# Patient Record
Sex: Female | Born: 1945 | Race: White | Hispanic: No | Marital: Single | Smoking: Never smoker
Health system: Southern US, Community
[De-identification: ages and names within clinical notes are randomized; demographics above are authoritative.]

## PROBLEM LIST (undated history)

## (undated) DIAGNOSIS — I10 Essential (primary) hypertension: Secondary | ICD-10-CM

## (undated) DIAGNOSIS — J45909 Unspecified asthma, uncomplicated: Secondary | ICD-10-CM

## (undated) DIAGNOSIS — E079 Disorder of thyroid, unspecified: Secondary | ICD-10-CM

## (undated) DIAGNOSIS — C801 Malignant (primary) neoplasm, unspecified: Secondary | ICD-10-CM

## (undated) HISTORY — DX: Malignant (primary) neoplasm, unspecified: C80.1

## (undated) HISTORY — PX: APPENDECTOMY: SHX54

## (undated) HISTORY — PX: CARDIAC VALVE REPLACEMENT: SHX585

## (undated) HISTORY — PX: BRAIN SURGERY: SHX531

## (undated) HISTORY — DX: Disorder of thyroid, unspecified: E07.9

## (undated) HISTORY — DX: Essential (primary) hypertension: I10

## (undated) HISTORY — PX: BREAST SURGERY: SHX581

## (undated) HISTORY — DX: Unspecified asthma, uncomplicated: J45.909

## (undated) HISTORY — PX: TUBAL LIGATION: SHX77

---

## 2008-06-15 ENCOUNTER — Observation Stay (HOSPITAL_COMMUNITY): Admission: EM | Admit: 2008-06-15 | Discharge: 2008-06-16 | Payer: Self-pay | Admitting: Emergency Medicine

## 2008-06-15 ENCOUNTER — Ambulatory Visit: Payer: Self-pay | Admitting: Internal Medicine

## 2008-06-16 ENCOUNTER — Encounter (INDEPENDENT_AMBULATORY_CARE_PROVIDER_SITE_OTHER): Payer: Self-pay | Admitting: Internal Medicine

## 2008-06-17 ENCOUNTER — Ambulatory Visit: Payer: Self-pay

## 2009-04-21 ENCOUNTER — Telehealth: Payer: Self-pay | Admitting: Internal Medicine

## 2009-05-02 ENCOUNTER — Telehealth: Payer: Self-pay | Admitting: Internal Medicine

## 2011-03-27 NOTE — H&P (Signed)
NAMEBRENDA, Miranda Elliott                 ACCOUNT NO.:  [id number]   MEDICAL RECORD NO.:  [id number]          PATIENT TYPE:  EMS   LOCATION:  MAJO                         FACILITY:  [facility]   PHYSICIAN:  Vania Rea, M.D. DATE OF BIRTH:  08/29/1946   DATE OF ADMISSION:  06/15/2008  DATE OF DISCHARGE:                              HISTORY & PHYSICAL   PRIMARY CARE PHYSICIAN:  Dr. Rushie Nyhan in [place], telephone  number [phone].   CHIEF COMPLAINT:  Chest pain.   HISTORY OF PRESENT ILLNESS:  This is a 65 year old Caucasian lady who  works as a Runner, broadcasting/film/video and has been traveling out of her home of [place]  since June.  She recently returned from the [place].  She had a fever a few  days before she returned and did not feel well.  She went to a clinic  and they checked for H1N1 virus, and this was negative.  She awoke last  night with difficulty breathing, felt she may have been having an asthma  attack.  She called her doctor and he said she could use her husband's  Symbicort.  She used the Symbicort and did feel better, fell back  asleep, but later woke up with severe chest pain that was not radiating,  was not associated with diaphoresis, but was associated with continuous  soreness and pressure in her chest.  It was not likely any pain she has  previously felt and not like her peptic ulcer disease.  She called an  ambulance and came to the emergency room after having taken as aspirin,  but the pain had already subsided.  She did have nitroglycerin paste  placed.  That has given her a headache, but has not really made much  difference to the pain.   PAST MEDICAL HISTORY:  1. Remote history of asthma.  2. Hypertension, well controlled.  3. Hypothyroidism.  4. Peptic ulcer disease.  5. Recurrent urinary tract infections.  6. Migraines.  7. Aortic enlargement with aortic insufficiency.   MEDICATIONS:  Azor, calcium, Cozaar, nitrofurantoin, omeprazole,  Topamax, Urex, Coenzyme  Q10, Synthroid 112 mcg daily.  The patient does  not remember the doses of her medications.   ALLERGIES:  NO KNOWN DRUG ALLERGIES.   SOCIAL HISTORY:  Works as a Runner, broadcasting/film/video.  Denies tobacco, alcohol or illicit  drug use.   FAMILY HISTORY:  There is no family history of coronary artery disease.   REVIEW OF SYSTEMS:  Other than noted above, significant only for the  fact that the patient had a coronary angiogram about a year ago and was  told that her coronary arteries are completely clean.   PHYSICAL EXAMINATION:  GENERAL:  A very pleasant, but somewhat anxious  small-built middle-aged Caucasian lady sitting up on a stretcher.  VITAL SIGNS:  Temperature is 97.7, pulse 77, respirations 20, blood  pressure 113/62.  She is saturating at 97% on 2 liters.  HEENT:  Pupils are round and equal.  Mucous membranes are pink and  anicteric.  She is not dehydrated.  NECK:  She has no thyromegaly.  CHEST:  Clear to auscultation bilaterally.  CARDIOVASCULAR:  Regular rhythm with a 2/6 systolic murmur.  ABDOMEN:  Soft and nontender.  EXTREMITIES:  Without edema.  CENTRAL NERVOUS SYSTEM:  Cranial nerves II through XII are grossly  intact.  She has no focal neurologic deficit.   LABORATORY DATA:  CBC is completely normal.  Her platelet count is 142.  Coags are normal.  Her D-dimer is 0.38.  Beta natriuretic peptide is  undetectable.  Her sodium is 140, potassium 3.4, chloride 107, CO2 of  22, glucose 98, BUN 21, creatinine 0.72.  Her calcium is 7.3, magnesium  2.0, troponin is 0.01.  Chest x-ray shows no acute disease.  EKG is not  in evidence at this time, but I was told that it showed no evidence of  acute disease.   ASSESSMENT:  1. Atypical chest pain in a middle-aged lady with some risk factors.  2. Hypokalemia.  3. Dehydration as evidenced by elevated BUN and creatinine ratio.   PLAN:  Will admit this lady for hydration.  Replace her potassium and  serial cardiac enzymes to rule out an acute  MI.  Will also give her high-  dose PPIs in case she is having GERD symptoms and will give her p.r.n.  nebs.  Other plans as per orders.      Vania Rea, M.D.  Electronically Signed     LC/MEDQ  D:  06/15/2008  T:  06/15/2008  Job:  [id number]   cc:   Rushie Nyhan, MD

## 2011-03-27 NOTE — Discharge Summary (Signed)
NAMELevora Elliott Aldora                 ACCOUNT NO.:  [id number]   MEDICAL RECORD NO.:  [id number]          PATIENT TYPE:  OBV   LOCATION:  2006                         FACILITY:  [facility]   PHYSICIAN:  Lonia Blood, M.D.       DATE OF BIRTH:  [birth year]   DATE OF ADMISSION:  06/15/2008  DATE OF DISCHARGE:  06/16/2008                               DISCHARGE SUMMARY   PRIMARY CARE PHYSICIAN:  The patient's primary care physician is in [place], [place].   DISCHARGE DIAGNOSES:  1. Chest pain - scheduled for outpatient stress test.  2. Hyperreactive airways.  3. History of asthma.  4. Hypertension.  5. Hypothyroidism.  6. Migraine headaches.  7. Urinary tract infection.   DISCHARGE MEDICATIONS:  1. Synthroid 112 mcg daily.  2. Omeprazole 20 mg daily.  3. Ventolin 1 puff 3 times a day as needed.  4. Azor daily.  5. Topamax twice a day.  6. Urex daily.   CONDITION ON DISCHARGE:  Ms. Miranda Elliott was discharged in good condition.  At the time of discharge, she is asymptomatic.  She will follow up with  Dr. Gala Romney from [facility] on June 17, 2008 for an  outpatient stress test.   PROCEDURES:  During this admission, the patient had a transthoracic  echocardiogram which was read by Dr. Gala Romney and felt that she was  without any wall motion abnormalities with a mild aortic insufficiency  and a mild aortic stenosis.   HISTORY AND PHYSICAL:  Refer to the dictated H&P done by Dr. Vania Rea on June 15, 2008.   HOSPITAL COURSE:  1. Chest pain.  Ms. Miranda Elliott was admitted for chest pain rule out MI.      She had 3 sets cardiac enzymes which were all within normal limits.      She had a transthoracic echocardiogram which was fairly normal.      She had some negative T-waves in V1-V3 and for this reason, she is      scheduled for an outpatient stress test.  The patient had D-dimer      level of 0.38 and normal oxygen saturations on room air ruling out      possibility of  pulmonary emboli.  Given the patient's history of      asthma and her travel across the country and exposure to new      allergens, I do feel that her symptoms might be secondary to      hyperreactive airways.  For this reason, I prescribed the patient a      Ventolin inhaler.  2. Hypertension.  The patient was found to take 2 angiotensin receptor      blockers.  She was instructed to stop the      Cozaar and use Azor daily.  3. Urinary tract infection.  Ms. Miranda Elliott was placed on ciprofloxacin,      and she did defervesce and responded nicely to the medication.  She      is prescribed 5 more days of this antibiotic as an outpatient.  Lonia Blood, M.D.  Electronically Signed     SL/MEDQ  D:  06/16/2008  T:  06/17/2008  Job:  [id number]

## 2011-03-27 NOTE — Consult Note (Signed)
NAMELevora Elliott Elliott                 ACCOUNT NO.:  [id number]   MEDICAL RECORD NO.:  [id number]          PATIENT TYPE:  INP   LOCATION:  2006                         FACILITY:  [facility]   PHYSICIAN:  Bevelyn Buckles. Bensimhon, MDDATE OF BIRTH:  [birth year]   DATE OF CONSULTATION:  06/15/2008  DATE OF DISCHARGE:                                 CONSULTATION   REQUESTING PHYSICIAN:  Miranda Pier, MD, with [facility].   REASON FOR CONSULTATION:  Chest pain and shortness of breath.   Ms. Miranda Elliott is a very pleasant 65 year old woman who lives in [place].  She has a history of hypertension, hypothyroidism, aortic  insufficiency and migraine headaches as well as peptic ulcer disease.  She also has a history of intermittent chest pain.  She has been  followed by Dr. Lynnae January in [facility], for her aortic insufficiency  which appears to be stable.  Last year she had an episode of severe  chest pain, underwent cardiac catheterization, had normal coronary  arteries and normal LV function.   She was recently in the [place] on her honeymoon with her husband.  She  returned back to [facility] to visit family about a week ago.  On Sunday  night she developed fever and went to the clinic and was found to have a  UTI, she also had a nasal swab for flu which was negative.  Last night  or early this morning, she felt short of breath, similar to her previous  asthma.  She took her husband's Symbicort inhaler and felt much better;  however, about 3 hours later she awoke with intense chest pain, this  lasted about 5 minutes, there was no associated symptoms.  EMS was  called.  By the time they got there, she felt much better.  She was  brought to the emergency room for evaluation.   She currently feels fine, has been able to ambulate without any chest  pain.  She has not had any orthopnea, no PND, no lower extremity edema,  no mental status changes.  She has not had any reflux disease.  There  has been no  melena or bright red blood per rectum.   REVIEW OF SYSTEMS:  Is as per HPI and problem list, otherwise all  systems negative.   PROBLEM LIST:  1. History of chest pain.      a.     Normal coronary arteries by cardiac catheterization about a       year ago.  2. History of asthma.  3. Hypertension.  4. Hypothyroidism.  5. Aortic insufficiency which has been stable.  6. Peptic ulcer disease.  7. Migraine headaches.   MEDICATIONS ON ADMISSION:  Azor and Cozaar, also calcium,  nitrofurantoin, Prilosec, Topamax, Urex, Coenzyme Q12 and Synthroid.   ALLERGIES:  No known drug allergies.   SOCIAL HISTORY:  She works as a Runner, broadcasting/film/video in [employer].  No alcohol or  tobacco use.  She is very active.   FAMILY HISTORY:  There is no family history of premature coronary artery  disease.   PHYSICAL EXAM:  She is  well-appearing, no acute distress.  Respirations  are unlabored.  Temperature is 100.2, blood pressure is 98/67, heart  rate is 96, she is sating 95% on room air.  HEENT:  Normal.  NECK:  Supple.  There is no JVD.  Carotids are 2+ bilaterally with  bilateral radiated bruits.  There is no lymphadenopathy or thyromegaly.  CARDIAC:  She has a regular rate and rhythm with a prominent 3/6  systolic ejection murmur at the right sternal border on Valsalva.  She  does also have mild diastolic murmur, there is no rub or gallop.  LUNGS:  Are clear, no wheezing.  ABDOMEN:  Soft, nontender, nondistended.  No hepatosplenomegaly, no  bruits.  No masses.  EXTREMITIES:  Warm with no cyanosis, clubbing or edema.  There is good  distal pulses, there is no rashes, there is no splinter hemorrhages,  there is no Janeway lesions or Osler's nodes.   Chest x-ray shows mild bibasilar atelectasis, no acute edema.  EKG shows  normal sinus rhythm, there is nonspecific ST-T wave changes for the V1-  V3, there is no old for comparison.  TSH is normal, troponin is 0.02 and  0.01, MBs are negative.  Urinalysis is  positive.  BNP is less than 30.  D-dimer is negative at 0.38.  BMET is sodium 140, potassium 3.3, BUN 21,  creatinine 0.72.  CBC shows a white count of 7.5, platelets of 142 and  hemoglobin of 12.7.   ASSESSMENT:  1. Chest pain, atypical, now resolved.  2. History of asthma.  3. Aortic insufficiency.  4. Urinary tract infection and fever.   PLAN/DISCUSSION:  I suspect her chest pain is noncardiac.  She has had a  cardiac catheterization just a year ago which showed normal coronary  arteries.  I think we can discharge her home as long as she is feeling  better.  We will set up an echocardiogram tomorrow to further evaluate  her aortic valve and make sure there has not been any significant  change.  She does not have any stigmata of endocarditis but I do think  given her fever it is important to make sure the valve is okay.  I will  discuss this with the rounding team.  We will also check an EKG prior to  discharge to make sure there has been no significant change.      Bevelyn Buckles. Bensimhon, MD  Electronically Signed     Bevelyn Buckles. Bensimhon, MD  Electronically Signed    DRB/MEDQ  D:  06/15/2008  T:  06/15/2008  Job:  [id number]   cc:   Jene Every. Patricia Nettle, MD

## 2011-08-10 LAB — MAGNESIUM: Magnesium: 2.2

## 2011-08-10 LAB — URINALYSIS, ROUTINE W REFLEX MICROSCOPIC
Bilirubin Urine: NEGATIVE
Ketones, ur: 15 — AB
Nitrite: NEGATIVE
Protein, ur: NEGATIVE
Specific Gravity, Urine: 1.014
Urobilinogen, UA: 0.2

## 2011-08-10 LAB — URINE CULTURE: Colony Count: NO GROWTH

## 2011-08-10 LAB — B-NATRIURETIC PEPTIDE (CONVERTED LAB): Pro B Natriuretic peptide (BNP): <30

## 2011-08-10 LAB — CK TOTAL AND CKMB (NOT AT ARMC): Relative Index: INVALID

## 2011-08-10 LAB — BASIC METABOLIC PANEL
CO2: 22
Chloride: 107
Creatinine, Ser: 0.72
GFR calc Af Amer: >60
Potassium: 3.3 — ABNORMAL LOW

## 2011-08-10 LAB — PROTIME-INR
INR: 0.9
Prothrombin Time: 12.6

## 2011-08-10 LAB — TROPONIN I: Troponin I: 0.01

## 2011-08-10 LAB — CBC
HCT: 37.4
Hemoglobin: 12.7
MCHC: 34.1
MCV: 90.3
RBC: 4.14
WBC: 7.5

## 2011-08-10 LAB — CARDIAC PANEL(CRET KIN+CKTOT+MB+TROPI)
Relative Index: INVALID
Total CK: 36
Total CK: 44
Troponin I: 0.01

## 2011-08-10 LAB — TSH: TSH: 0.806

## 2011-08-10 LAB — URINE MICROSCOPIC-ADD ON

## 2011-08-10 LAB — APTT: aPTT: 34

## 2011-08-10 LAB — D-DIMER, QUANTITATIVE: D-Dimer, Quant: 0.38

## 2014-12-12 ENCOUNTER — Emergency Department (HOSPITAL_COMMUNITY): Payer: Medicare Other

## 2014-12-12 ENCOUNTER — Encounter (HOSPITAL_COMMUNITY): Payer: Self-pay | Admitting: Radiology

## 2014-12-12 ENCOUNTER — Emergency Department (HOSPITAL_COMMUNITY)
Admission: EM | Admit: 2014-12-12 | Discharge: 2014-12-12 | Disposition: A | Discharge status: Home / Self Care | Payer: Medicare Other | Attending: Emergency Medicine | Admitting: Emergency Medicine

## 2014-12-12 DIAGNOSIS — R079 Chest pain, unspecified: Secondary | ICD-10-CM | POA: Insufficient documentation

## 2014-12-12 DIAGNOSIS — Z7951 Long term (current) use of inhaled steroids: Secondary | ICD-10-CM | POA: Insufficient documentation

## 2014-12-12 DIAGNOSIS — Z8679 Personal history of other diseases of the circulatory system: Secondary | ICD-10-CM | POA: Insufficient documentation

## 2014-12-12 DIAGNOSIS — Z853 Personal history of malignant neoplasm of breast: Secondary | ICD-10-CM | POA: Insufficient documentation

## 2014-12-12 DIAGNOSIS — Z79818 Long term (current) use of other agents affecting estrogen receptors and estrogen levels: Secondary | ICD-10-CM | POA: Insufficient documentation

## 2014-12-12 DIAGNOSIS — Z79899 Other long term (current) drug therapy: Secondary | ICD-10-CM | POA: Insufficient documentation

## 2014-12-12 DIAGNOSIS — Z880 Allergy status to penicillin: Secondary | ICD-10-CM | POA: Insufficient documentation

## 2014-12-12 LAB — BASIC METABOLIC PANEL
Anion gap: 6 (ref 5–15)
BUN: 18 mg/dL (ref 6–23)
CO2: 26 mmol/L (ref 19–32)
Calcium: 10.1 mg/dL (ref 8.4–10.5)
Chloride: 110 mmol/L (ref 96–112)
Creatinine, Ser: 0.89 mg/dL (ref 0.50–1.10)
GFR calc Af Amer: 76 mL/min — ABNORMAL LOW (ref >90)
GFR calc non Af Amer: 66 mL/min — ABNORMAL LOW (ref >90)
Glucose, Bld: 112 mg/dL — ABNORMAL HIGH (ref 70–99)
Potassium: 4.1 mmol/L (ref 3.5–5.1)
Sodium: 142 mmol/L (ref 135–145)

## 2014-12-12 LAB — I-STAT TROPONIN, ED: Troponin i, poc: 0 ng/mL (ref 0.00–0.08)

## 2014-12-12 LAB — CBC
HCT: 41.9 % (ref 36.0–46.0)
Hemoglobin: 13.8 g/dL (ref 12.0–15.0)
MCH: 30.1 pg (ref 26.0–34.0)
MCHC: 32.9 g/dL (ref 30.0–36.0)
MCV: 91.5 fL (ref 78.0–100.0)
Platelets: 154 10*3/uL (ref 150–400)
RBC: 4.58 MIL/uL (ref 3.87–5.11)
RDW: 13.7 % (ref 11.5–15.5)
WBC: 7.3 10*3/uL (ref 4.0–10.5)

## 2014-12-12 MED ORDER — IOHEXOL 350 MG/ML SOLN
100.0000 mL | Freq: Once | INTRAVENOUS | Status: AC | PRN
  Administered 2014-12-12: 100 mL via INTRAVENOUS

## 2014-12-12 NOTE — Discharge Instructions (Signed)

## 2014-12-12 NOTE — ED Provider Notes (Signed)
CSN: [id number]     Arrival date & time 12/12/14  1504 History   First MD Initiated Contact with Patient 12/12/14 1604     Chief Complaint  Patient presents with  . Chest Pain     Patient is a 69 y.o. female presenting with chest pain. The history is provided by the patient.  Chest Pain Pain location:  Substernal area Pain quality: sharp   Pain radiates to:  Does not radiate Pain radiates to the back: no   Pain severity:  Moderate Onset quality:  Sudden Duration:  10 minutes Timing:  Intermittent Progression:  Resolved Chronicity:  New Context comment:  Standing up Relieved by:  None tried Worsened by:  Nothing tried Associated symptoms: no abdominal pain, no diaphoresis, no dizziness, no headache, no lower extremity edema, no shortness of breath, no syncope, not vomiting and no weakness   Patient reports she stood up and had CP for 10 minutes, now resolved No sob/weakness She has been feeling well recently No h/o CAD/PE/DVT She is here visiting from [place] No pleuritic pain is reported  PMH - breast cancer, aortic valve disease surg hx - aortic valve replacement, aortic aneurysm repair [facility] august 2015) Soc hx - lives in [place] History  Substance Use Topics  . Smoking status: Not on file  . Smokeless tobacco: Not on file  . Alcohol Use: Not on file   OB History    No data available     Review of Systems  Constitutional: Negative for diaphoresis.  Respiratory: Negative for shortness of breath.   Cardiovascular: Positive for chest pain. Negative for syncope.  Gastrointestinal: Negative for vomiting and abdominal pain.  Neurological: Negative for dizziness, syncope, weakness and headaches.  All other systems reviewed and are negative.     Allergies  Levaquin and Penicillins  Home Medications   Prior to Admission medications   Medication Sig Start Date End Date Taking? Authorizing Provider  anastrozole (ARIMIDEX) 1 MG tablet Take 1 mg by  mouth daily.   Yes Historical Provider, MD  atorvastatin (LIPITOR) 20 MG tablet Take 20 mg by mouth daily.   Yes Historical Provider, MD  budesonide-formoterol (SYMBICORT) 160-4.5 MCG/ACT inhaler Inhale 2 puffs into the lungs 2 (two) times daily.   Yes Historical Provider, MD  Calcium Carb-Cholecalciferol (CALCIUM 1000 + D PO) Take 1 tablet by mouth daily.   Yes Historical Provider, MD  Cholecalciferol 1000 UNITS capsule Take 1,000 Units by mouth daily.   Yes Historical Provider, MD  Cranberry 300 MG tablet Take 300 mg by mouth 2 (two) times daily.   Yes Historical Provider, MD  Cyanocobalamin (B-12 PO) Take 1 tablet by mouth daily.   Yes Historical Provider, MD  megestrol (MEGACE) 20 MG tablet Take 20 mg by mouth 2 (two) times daily.   Yes Historical Provider, MD  metoprolol tartrate (LOPRESSOR) 25 MG tablet Take 25 mg by mouth 2 (two) times daily.   Yes Historical Provider, MD  nitrofurantoin, macrocrystal-monohydrate, (MACROBID) 100 MG capsule Take 100 mg by mouth 2 (two) times daily as needed (UTI).   Yes Historical Provider, MD  Nutritional Supplements (ESTROVEN PO) Take 1 tablet by mouth 2 (two) times daily.   Yes Historical Provider, MD  pantoprazole (PROTONIX) 20 MG tablet Take 20 mg by mouth 2 (two) times daily.   Yes Historical Provider, MD  Thyroid 81.25 MG TABS Take 1 tablet by mouth daily.   Yes Historical Provider, MD  ZOLMitriptan (ZOMIG) 2.5 MG tablet Take 1.25 mg by  mouth as needed for migraine or headache.   Yes Historical Provider, MD   BP 152/82 mmHg  Pulse 65  Temp(Src) 98.4 F (36.9 C) (Oral)  Resp 12  Ht 5\' 3"  (1.6 m)  Wt 140 lb (63.504 kg)  BMI 24.81 kg/m2  SpO2 100% Physical Exam CONSTITUTIONAL: Well developed/well nourished HEAD: Normocephalic/atraumatic EYES: EOMI/PERRL ENMT: Mucous membranes moist NECK: supple no meningeal signs SPINE/BACK:entire spine nontender CV: S1/S2 noted, murmur noted LUNGS: Lungs are clear to auscultation bilaterally, no apparent  distress ABDOMEN: soft, nontender, no rebound or guarding, bowel sounds noted throughout abdomen GU:no cva tenderness NEURO: Pt is awake/alert/appropriate, moves all extremitiesx4.  No facial droop.   EXTREMITIES: pulses normal/equalx4, full ROM SKIN: warm, color normal, no calf tenderness/edema PSYCH: no abnormalities of mood noted, alert and oriented to situation  ED Course  Procedures  4:55 PM Pt with episode of CP several hrs ago (occurred at 1:30pm) that lasted 10 minutes Now resolved and no associated symptoms Workup pending at this time 5:30 PM Pt resting comfortably/feels improved however given history of aortic aneurysm repair as well as sharp CP, will perform CT chest and pt would like to have CT chest I doubt acute valvular emergency (no sob/dizziness) I have low suspicion for ACS at this time 7:51 PM Ct chest negative Pt well appearing, resting comfortably She is appropriate for d/c home and will f/u with cardiologist back in [place] once she returns home BP 111/48 mmHg  Pulse 74  Temp(Src) 98.4 F (36.9 C) (Oral)  Resp 21  Ht 5\' 3"  (1.6 m)  Wt 140 lb (63.504 kg)  BMI 24.81 kg/m2  SpO2 95%  Labs Review Labs Reviewed  BASIC METABOLIC PANEL - Abnormal; Notable for the following:    Glucose, Bld 112 (*)    GFR calc non Af Amer 66 (*)    GFR calc Af Amer 76 (*)    All other components within normal limits  CBC  I-STAT TROPOININ, ED    Imaging Review Dg Chest 2 View  12/12/2014   CLINICAL DATA:  Chest pain. History of hypertension. History of breast carcinoma  EXAM: CHEST  2 VIEW  COMPARISON:  June 15, 2008  FINDINGS: The lungs are mildly hyperexpanded but clear. The heart size and pulmonary vascularity are normal. Patient is status post aortic valve replacement. No pneumothorax. No adenopathy. There is postoperative change on the right with axillary clips on the right. No pneumothorax. No bone lesions.  IMPRESSION: Surgical clips in right axilla. Status post  aortic valve replacement. Lungs mildly hyperexpanded but clear.   Electronically Signed   By: Lowella Grip M.D.   On: 12/12/2014 16:48   Ct Angio Chest Aorta W/cm &/or Wo/cm  12/12/2014   CLINICAL DATA:  Chest pain. Acute aortic dissection. Aortic dissection protocol. Previous open heart surgery July 01, 2014.  EXAM: CT ANGIOGRAPHY CHEST WITH CONTRAST  TECHNIQUE: Multidetector CT imaging of the chest was performed using the standard protocol during bolus administration of intravenous contrast. Multiplanar CT image reconstructions and MIPs were obtained to evaluate the vascular anatomy.  CONTRAST:  132mL OMNIPAQUE IOHEXOL 350 MG/ML SOLN  COMPARISON:  12/12/2014.  06/15/2008.  FINDINGS: Bones: median sternotomy is present. No complicating features. Thoracic spondylosis, expected for age. No aggressive osseous lesions.  Cardiovascular: Negative for pulmonary embolus. Aortic root replacement is present with bioprosthetic aortic valve. The aortic replacement extends 6.5 cm cranially. There is ectasia of the aortic arch. Normal opacification of the aortic branch vessels. No aneurysmal dilation of  the arch or descending thoracic aorta. No dissection or acute aortic abnormality. The anastomosis of the aortic root is within normal limits.  Lungs: Mild atelectasis.  No airspace disease.  Central airways: Patent.  Effusions: None.  Lymphadenopathy: None.  RIGHT axillary dissection.  Esophagus: Normal.  Upper abdomen: Normal.  Other: None.  Review of the MIP images confirms the above findings.  IMPRESSION: 1. No acute abnormality. 2. Expected appearance following aortic root replacement and aortic valve replacement. No complicating features.   Electronically Signed   By: Dereck Ligas M.D.   On: 12/12/2014 19:30     EKG Interpretation   Date/Time:  Sunday December 12 2014 15:27:17 EST Ventricular Rate:  65 PR Interval:  137 QRS Duration: 89 QT Interval:  401 QTC Calculation: 417 R Axis:   40 Text  Interpretation:  Sinus rhythm Borderline T abnormalities, anterior  leads Abnormal ekg artifact noted Confirmed by Christy Gentles  MD, Elenore Rota  [id number]) on 12/12/2014 4:06:04 PM      MDM   Final diagnoses:  Chest pain  Chest pain, unspecified chest pain type    Nursing notes including past medical history and social history reviewed and considered in documentation xrays/imaging reviewed by myself and considered during evaluation Labs/vital reviewed myself and considered during evaluation Previous records reviewed and considered - per records - cardiac cath in 2008 in [place] was negative     Sharyon Cable, MD 12/12/14 1952

## 2014-12-12 NOTE — ED Notes (Addendum)
Pt presents via [facility] with c/o chest pain that lasted 10-14min prior to EMS arrival. Pain was in the center of the chest, sharp, and non-radiating.  Pt denies dizziness, N/V, or weakness. Pt is visiting from [place], but was seen 8 years ago by [employer] for cardiac incident. Pt had open heart surgery Jul 01, 2014, has right sided breast ca and is finished with Radiation now. Pt took 325mg  ASA PTA.  Pt is now pain free. Pt had Bovine Pericardial Heart Valve placed during her Jul 01 2014 surgery at [facility].

## 2014-12-12 NOTE — ED Notes (Signed)
Patient transported to CT 

## 2014-12-12 NOTE — ED Notes (Signed)
Pt. Left with all belongings 

## 2016-01-01 IMAGING — CR DG CHEST 2V
2 series · 2 of 2 positions shown · non-contrast
Comparison: June 15, 2008

CLINICAL DATA: Chest pain. History of hypertension. History of
breast carcinoma

EXAM:
CHEST  2 VIEW

[w chest pa]
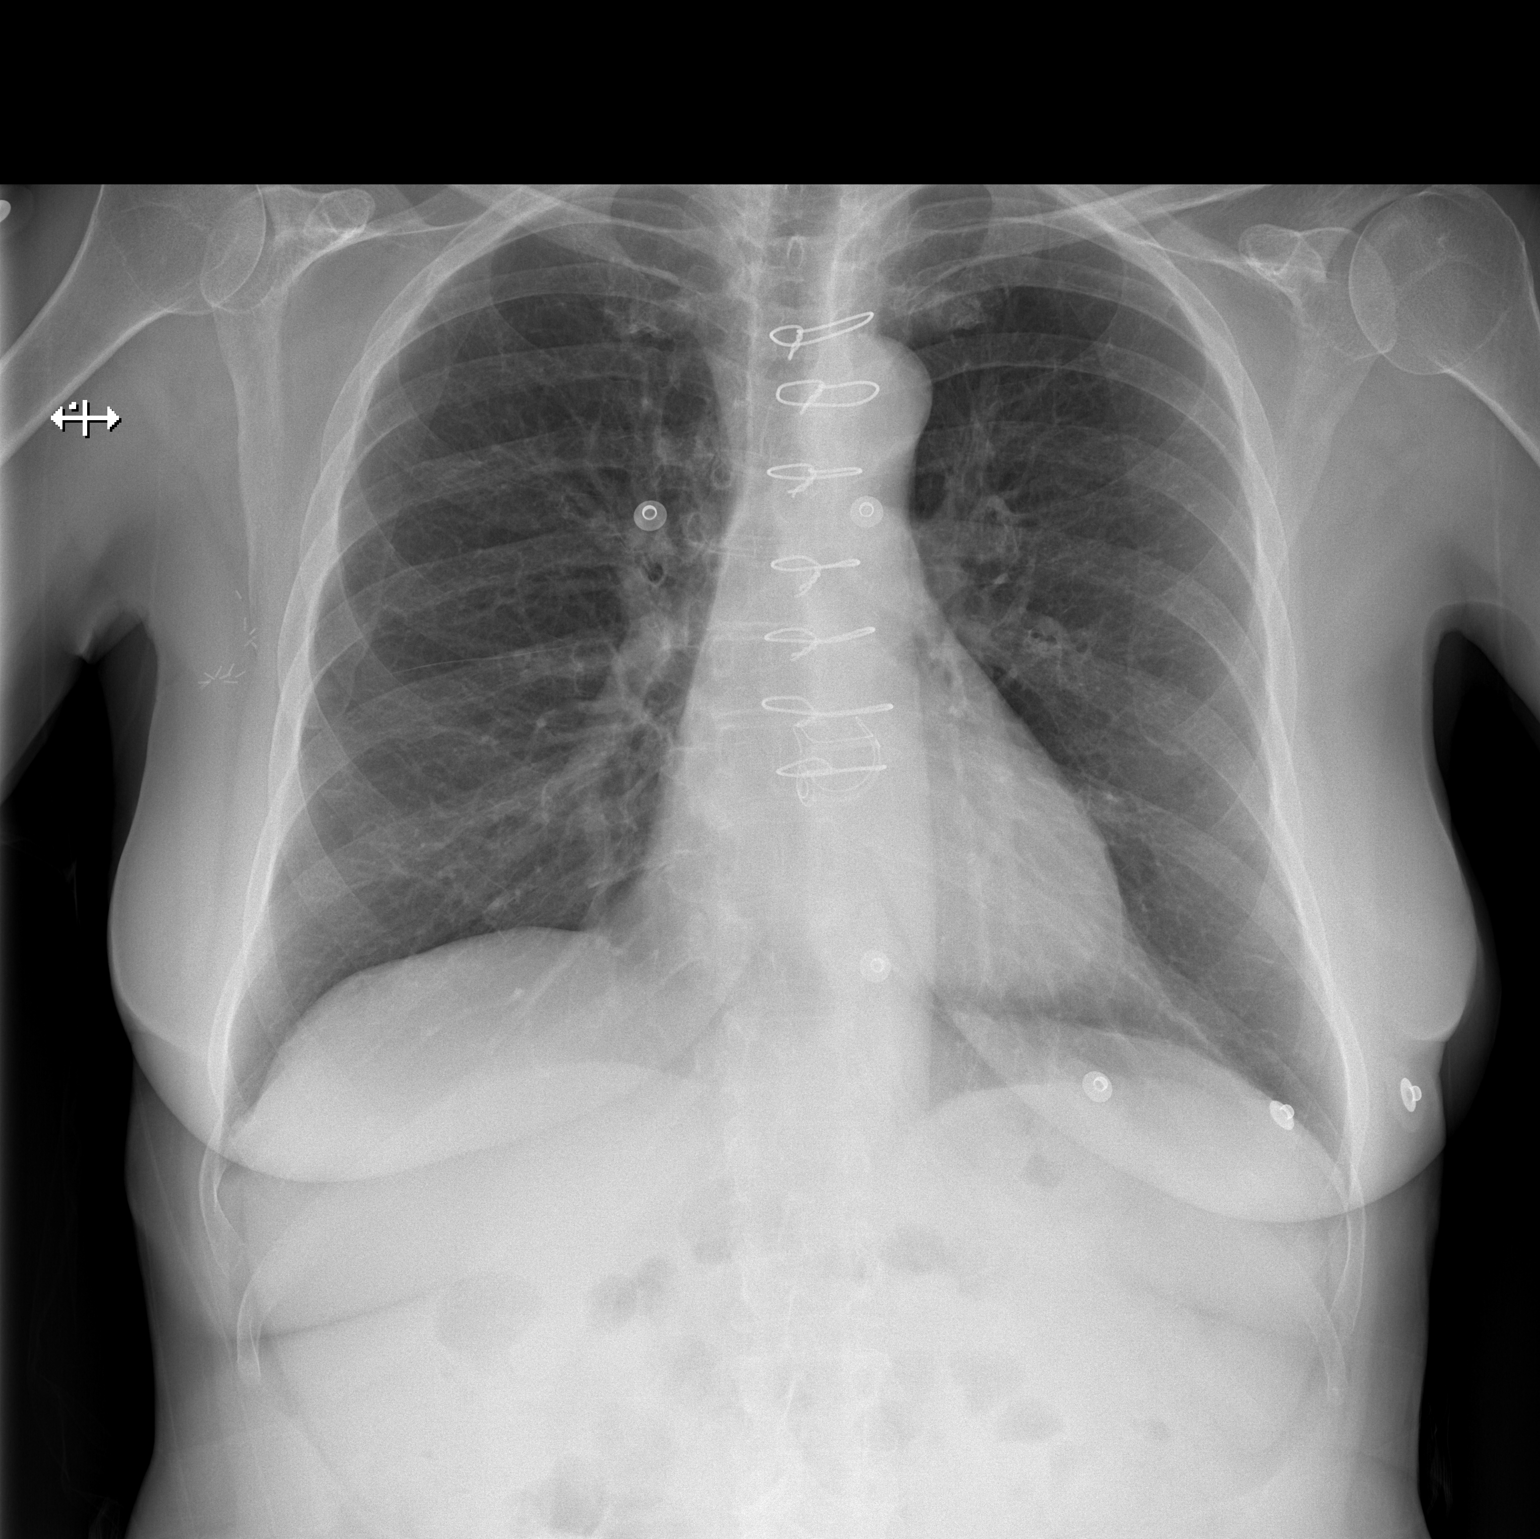

[w chest lat]
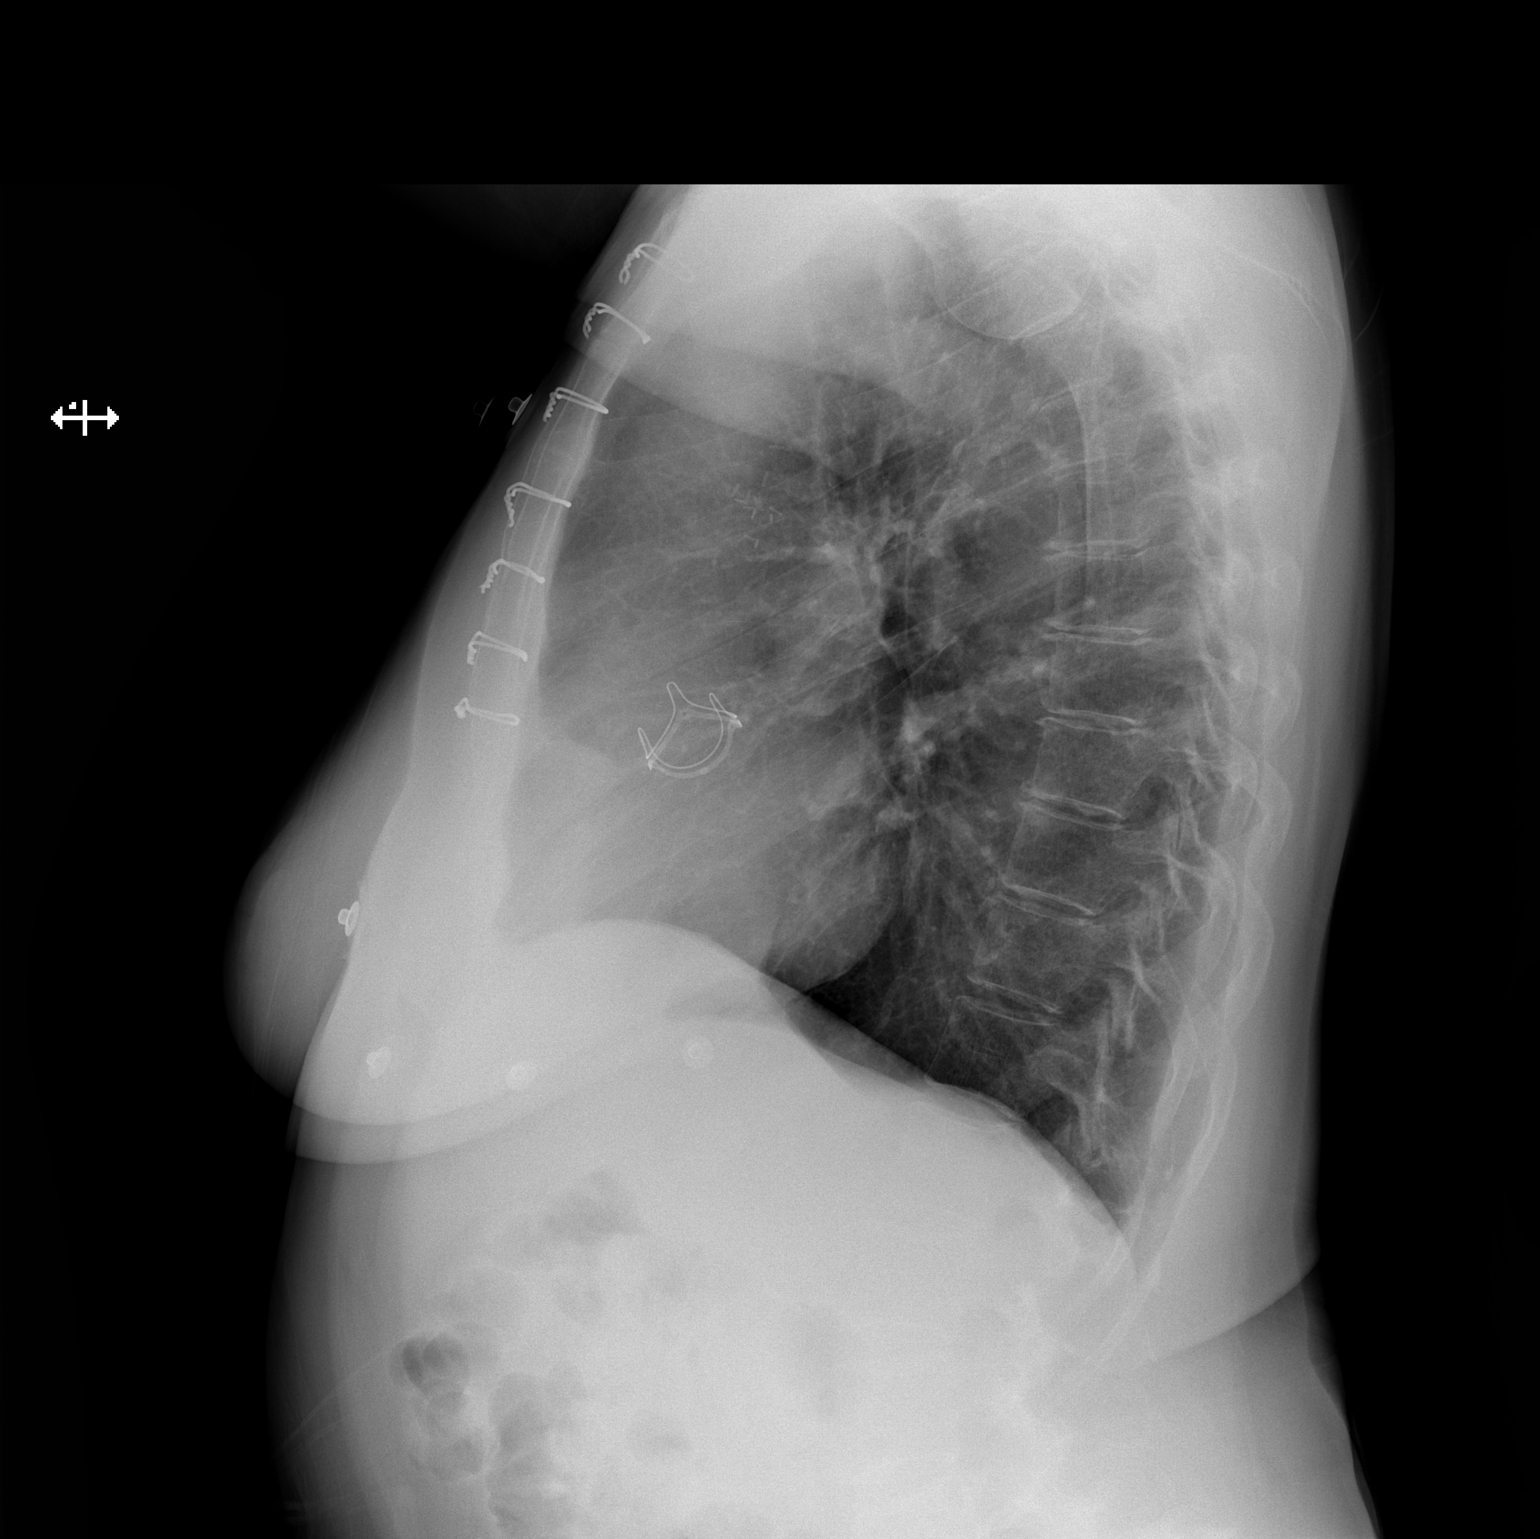

[2 of 2 positions shown; findings below may reference images not displayed]

FINDINGS: The lungs are mildly hyperexpanded but clear. The heart size and
pulmonary vascularity are normal. Patient is status post aortic
valve replacement. No pneumothorax. No adenopathy. There is
postoperative change on the right with axillary clips on the right.
No pneumothorax. No bone lesions.
IMPRESSION: Surgical clips in right axilla. Status post aortic valve
replacement. Lungs mildly hyperexpanded but clear.

## 2016-02-12 ENCOUNTER — Ambulatory Visit (INDEPENDENT_AMBULATORY_CARE_PROVIDER_SITE_OTHER): Payer: Medicare Other | Admitting: Physician Assistant

## 2016-02-12 VITALS — BP 122/70 | HR 82 | Temp 98.1°F | Resp 17 | Ht 61.0 in | Wt 146.0 lb

## 2016-02-12 DIAGNOSIS — R300 Dysuria: Secondary | ICD-10-CM

## 2016-02-12 DIAGNOSIS — N3001 Acute cystitis with hematuria: Secondary | ICD-10-CM | POA: Diagnosis not present

## 2016-02-12 LAB — POCT URINALYSIS DIP (MANUAL ENTRY)
BILIRUBIN UA: NEGATIVE
Bilirubin, UA: NEGATIVE
GLUCOSE UA: NEGATIVE
Nitrite, UA: NEGATIVE
SPEC GRAV UA: 1.02
UROBILINOGEN UA: 0.2
pH, UA: 6.5

## 2016-02-12 LAB — POC MICROSCOPIC URINALYSIS (UMFC)

## 2016-02-12 LAB — URINE CULTURE
Colony Count: NO GROWTH
Organism ID, Bacteria: NO GROWTH

## 2016-02-12 MED ORDER — CEFUROXIME AXETIL 500 MG PO TABS: 500.0000 mg | ORAL_TABLET | Freq: Two times a day (BID) | ORAL | Status: AC

## 2016-02-12 NOTE — Progress Notes (Signed)
[facility] [address] [place] [address] [phone]  Date:  02/12/2016   Name:  Miranda Elliott   DOB:  [birth year]   MRN:  [id number]  PCP:  No primary care provider on file.   Chief Complaint  Patient presents with  . Dysuria  . Abdominal Pain    History of Present Illness:  Miranda Elliott is a 70 y.o. female patient who presents to [facility] for dysuria.    Dysuria, abdominal pain and pressure that is uncomfortable.  She has also noticed that she is urinating often.  She has no nausea, back pain, or fever.  no change in vaginal discharge or odor.   She has a hx of uti which she is followed in her hometown in [place].  She is visiting family here.  She has taken cephalosporins before without allergy though she is allergic to penicillins.  They have also helped to resolve her urinary tract infections.  She has not done anything to treat this at this time, besides hydration.    There are no active problems to display for this patient.   Past Medical History  Diagnosis Date  . Asthma   . Cancer ([place])   . Hypertension   . Thyroid disease     Past Surgical History  Procedure Laterality Date  . Appendectomy    . Brain surgery    . Breast surgery    . Colon surgery    . Tubal ligation    . Cardiac valve replacement      Social History  Substance Use Topics  . Smoking status: Never Smoker   . Smokeless tobacco: None  . Alcohol Use: No    Family History  Problem Relation Age of Onset  . Cancer Mother   . Heart disease Mother   . Hypertension Father   . Stroke Father     Allergies  Allergen Reactions  . Levaquin [Levofloxacin In D5w]   . Penicillins     Medication list has been reviewed and updated.  Current Outpatient Prescriptions on File Prior to Visit  Medication Sig Dispense Refill  . anastrozole (ARIMIDEX) 1 MG tablet Take 1 mg by mouth daily.    Marland Kitchen atorvastatin (LIPITOR) 20 MG tablet Take 20 mg by mouth daily.    .  budesonide-formoterol (SYMBICORT) 160-4.5 MCG/ACT inhaler Inhale 2 puffs into the lungs 2 (two) times daily.    . Calcium Carb-Cholecalciferol (CALCIUM 1000 + D PO) Take 1 tablet by mouth daily.    . Cholecalciferol 1000 UNITS capsule Take 1,000 Units by mouth daily.    . Cranberry 300 MG tablet Take 300 mg by mouth 2 (two) times daily.    . Cyanocobalamin (B-12 PO) Take 1 tablet by mouth daily.    . megestrol (MEGACE) 20 MG tablet Take 20 mg by mouth 2 (two) times daily.    . metoprolol tartrate (LOPRESSOR) 25 MG tablet Take 25 mg by mouth 2 (two) times daily.    . nitrofurantoin, macrocrystal-monohydrate, (MACROBID) 100 MG capsule Take 100 mg by mouth 2 (two) times daily as needed (UTI).    . Nutritional Supplements (ESTROVEN PO) Take 1 tablet by mouth 2 (two) times daily.    . pantoprazole (PROTONIX) 20 MG tablet Take 20 mg by mouth 2 (two) times daily.    . Thyroid 81.25 MG TABS Take 1 tablet by mouth daily.    Marland Kitchen ZOLMitriptan (ZOMIG) 2.5 MG tablet Take 1.25 mg by mouth as needed  for migraine or headache.     No current facility-administered medications on file prior to visit.    ROS ROS otherwise unremarkable unless listed above.   Physical Examination: BP 122/70 mmHg  Pulse 82  Temp(Src) 98.1 F (36.7 C) (Oral)  Resp 17  Ht 5\' 1"  (1.549 m)  Wt 146 lb (66.225 kg)  BMI 27.60 kg/m2  SpO2 96% Ideal Body Weight: Weight in (lb) to have BMI = 25: 132  Physical Exam  Constitutional: She is oriented to person, place, and time. She appears well-developed and well-nourished. No distress.  HENT:  Head: Normocephalic and atraumatic.  Right Ear: External ear normal.  Left Ear: External ear normal.  Eyes: Conjunctivae and EOM are normal. Pupils are equal, round, and reactive to light.  Cardiovascular: Normal rate.   Pulmonary/Chest: Effort normal. No respiratory distress.  Abdominal: Soft. Normal appearance and bowel sounds are normal. There is no hepatosplenomegaly. There is tenderness  (minimal (more pressure)) in the suprapubic area. There is no CVA tenderness.  Neurological: She is alert and oriented to person, place, and time.  Skin: She is not diaphoretic.  Psychiatric: She has a normal mood and affect. Her behavior is normal.     Assessment and Plan: ROBBI RAINEY is a 70 y.o. female who is here today for hx of dysuria.  We will treat with ceftin as this was responsive in the past.  Will place urine culture at this time.  Dysuria - Plan: POCT urinalysis dipstick, POCT Microscopic Urinalysis ([facility]), cefUROXime (CEFTIN) 500 MG tablet, Urine culture  Acute cystitis with hematuria    Ivar Drape, PA-C [facility] 02/12/2016 12:21 PM

## 2016-02-12 NOTE — Patient Instructions (Addendum)
IF you received an x-ray today, you will receive an invoice from [facility] Rad[facility]. Please contact [facility] Radiology at [phone] with questions or concerns regarding your invoice.   IF you received labwork today, you will receive an invoice from [employer]. Please contact [facility] at [phone] with questions or concerns regarding your invoice.   Our billing staff will not be able to assist you with questions regarding bills from these companies.  You will be contacted with the lab results as soon as they are available. The fastest way to get your results is to activate your My Chart account. Instructions are located on the last page of this paperwork. If you have not heard from [place] regarding the results in 2 weeks, please contact this office.    Hydrate well with 64 oz of water or more.  You can take tylenol or ibuprofen for pain.   I will have your urine culture within 7 days.  Sooner if the bacteria is not covered.   Urinary Tract Infection Urinary tract infections (UTIs) can develop anywhere along your urinary tract. Your urinary tract is your body's drainage system for removing wastes and extra water. Your urinary tract includes two kidneys, two ureters, a bladder, and a urethra. Your kidneys are a pair of bean-shaped organs. Each kidney is about the size of your fist. They are located below your ribs, one on each side of your spine. CAUSES Infections are caused by microbes, which are microscopic organisms, including fungi, viruses, and bacteria. These organisms are so small that they can only be seen through a microscope. Bacteria are the microbes that most commonly cause UTIs. SYMPTOMS  Symptoms of UTIs may vary by age and gender of the patient and by the location of the infection. Symptoms in young women typically include a frequent and intense urge to urinate and a painful, burning feeling in the bladder or urethra during urination. Older women  and men are more likely to be tired, shaky, and weak and have muscle aches and abdominal pain. A fever may mean the infection is in your kidneys. Other symptoms of a kidney infection include pain in your back or sides below the ribs, nausea, and vomiting. DIAGNOSIS To diagnose a UTI, your caregiver will ask you about your symptoms. Your caregiver will also ask you to provide a urine sample. The urine sample will be tested for bacteria and white blood cells. White blood cells are made by your body to help fight infection. TREATMENT  Typically, UTIs can be treated with medication. Because most UTIs are caused by a bacterial infection, they usually can be treated with the use of antibiotics. The choice of antibiotic and length of treatment depend on your symptoms and the type of bacteria causing your infection. HOME CARE INSTRUCTIONS  If you were prescribed antibiotics, take them exactly as your caregiver instructs you. Finish the medication even if you feel better after you have only taken some of the medication.  Drink enough water and fluids to keep your urine clear or pale yellow.  Avoid caffeine, tea, and carbonated beverages. They tend to irritate your bladder.  Empty your bladder often. Avoid holding urine for long periods of time.  Empty your bladder before and after sexual intercourse.  After a bowel movement, women should cleanse from front to back. Use each tissue only once. SEEK MEDICAL CARE IF:   You have back pain.  You develop a fever.  Your symptoms do not begin to resolve  within 3 days. SEEK IMMEDIATE MEDICAL CARE IF:   You have severe back pain or lower abdominal pain.  You develop chills.  You have nausea or vomiting.  You have continued burning or discomfort with urination. MAKE SURE YOU:   Understand these instructions.  Will watch your condition.  Will get help right away if you are not doing well or get worse.   This information is not intended to replace  advice given to you by your health care provider. Make sure you discuss any questions you have with your health care provider.   Document Released: 08/08/2005 Document Revised: 07/20/2015 Document Reviewed: 12/07/2011 Elsevier Interactive Patient Education [employer].

## 2016-02-20 ENCOUNTER — Telehealth: Payer: Self-pay

## 2016-02-20 NOTE — Telephone Encounter (Signed)
Pt called in and stated that Colletta Maryland English called and left a message with her husband. She says she would like to speak to [place] about the message. She can be reached @ [phone]. Thank you

## 2016-02-21 NOTE — Telephone Encounter (Addendum)
She had questions about the no growth.  She states that her symptoms have now resolved.  This took a few days for her symptoms to improve.  Her urine analysis appeared to be consistent to an infection.  She reports no abx use prior to her visit.  sxs resolved. No further work up.

## 2016-08-06 ENCOUNTER — Ambulatory Visit (INDEPENDENT_AMBULATORY_CARE_PROVIDER_SITE_OTHER): Payer: Medicare Other | Admitting: Family Medicine

## 2016-08-06 VITALS — BP 132/80 | HR 83 | Temp 97.9°F | Resp 16 | Ht 62.0 in | Wt 145.0 lb

## 2016-08-06 DIAGNOSIS — R319 Hematuria, unspecified: Secondary | ICD-10-CM

## 2016-08-06 DIAGNOSIS — Z23 Encounter for immunization: Secondary | ICD-10-CM

## 2016-08-06 DIAGNOSIS — N3001 Acute cystitis with hematuria: Secondary | ICD-10-CM | POA: Diagnosis not present

## 2016-08-06 LAB — POCT URINALYSIS DIP (MANUAL ENTRY)
Bilirubin, UA: NEGATIVE
GLUCOSE UA: NEGATIVE
Ketones, POC UA: NEGATIVE
Nitrite, UA: NEGATIVE
PH UA: 6
Spec Grav, UA: >=1.03
UROBILINOGEN UA: 0.2

## 2016-08-06 LAB — POC MICROSCOPIC URINALYSIS (UMFC): Mucus: ABSENT

## 2016-08-06 MED ORDER — CEFUROXIME AXETIL 500 MG PO TABS: 500.0000 mg | ORAL_TABLET | Freq: Two times a day (BID) | ORAL | 0 refills | Status: DC

## 2016-08-06 NOTE — Progress Notes (Signed)
Patient ID: Miranda Elliott, female    DOB: 28-May-1946, 70 y.o.   MRN: [id number]  PCP: No primary care provider on file.  Chief Complaint  Patient presents with  . Hematuria    x today   . Immunizations    flu  . Urinary Frequency    x this morning    Subjective:   HPI 70 year old female presents with a complaint of urinary frequency with hematuria times one day.  Patient reports a chronic history if urinary tract infection. She resides in [place], [place] and see's a urologist regularly. Patient reports last UTI approximately 1 month ago and she was treated with Ceftin. She reports this morning experiencing pelvic pressure and visible blood on toilet paper. Denies low back pain, fever, or chills. She reports this is her 3rd UTI within the last 5 months.  Social History   Social History  . Marital status: Single    Spouse name: N/A  . Number of children: N/A  . Years of education: N/A   Occupational History  . Not on file.   Social History Main Topics  . Smoking status: Never Smoker  . Smokeless tobacco: Never Used  . Alcohol use No  . Drug use: No  . Sexual activity: No   Other Topics Concern  . Not on file   Social History Narrative  . No narrative on file    Family History  Problem Relation Age of Onset  . Cancer Mother   . Heart disease Mother   . Hypertension Father   . Stroke Father    Review of Systems  See HPI There are no active problems to display for this patient.    Prior to Admission medications   Medication Sig Start Date End Date Taking? Authorizing Provider  amlodipine-atorvastatin (CADUET) 2.5-10 MG tablet Take 1 tablet by mouth daily.   Yes Historical Provider, MD  anastrozole (ARIMIDEX) 1 MG tablet Take 1 mg by mouth daily.   Yes Historical Provider, MD  atorvastatin (LIPITOR) 20 MG tablet Take 20 mg by mouth daily.   Yes Historical Provider, MD  budesonide-formoterol (SYMBICORT) 160-4.5 MCG/ACT inhaler Inhale 2 puffs into the  lungs 2 (two) times daily.   Yes Historical Provider, MD  Calcium Carb-Cholecalciferol (CALCIUM 1000 + D PO) Take 1 tablet by mouth daily.   Yes Historical Provider, MD  Cholecalciferol 1000 UNITS capsule Take 1,000 Units by mouth daily.   Yes Historical Provider, MD  Cranberry 300 MG tablet Take 300 mg by mouth 2 (two) times daily.   Yes Historical Provider, MD  Cyanocobalamin (B-12 PO) Take 1 tablet by mouth daily.   Yes Historical Provider, MD  Nutritional Supplements (ESTROVEN PO) Take 1 tablet by mouth 2 (two) times daily.   Yes Historical Provider, MD  pantoprazole (PROTONIX) 20 MG tablet Take 20 mg by mouth 2 (two) times daily.   Yes Historical Provider, MD  progesterone (PROMETRIUM) 100 MG capsule Take 100 mg by mouth daily.   Yes Historical Provider, MD  Thyroid 81.25 MG TABS Take 1 tablet by mouth daily.   Yes Historical Provider, MD  ZOLMitriptan (ZOMIG) 2.5 MG tablet Take 1.25 mg by mouth as needed for migraine or headache.   Yes Historical Provider, MD    Allergies  Allergen Reactions  . Levaquin [Levofloxacin In D5w]   . Penicillins       Objective:  Physical Exam  Constitutional: She appears well-developed and well-nourished.  HENT:  Head: Normocephalic and atraumatic.  Right Ear: External ear normal.  Left Ear: External ear normal.  Nose: Nose normal.  Mouth/Throat: Oropharynx is clear and moist.  Eyes: Conjunctivae and EOM are normal. Pupils are equal, round, and reactive to light.  Neck: Normal range of motion. Neck supple.  Cardiovascular: Normal rate, regular rhythm, normal heart sounds and intact distal pulses.   Pulmonary/Chest: Effort normal.  Abdominal: Soft. Bowel sounds are normal.  Negative for CVA tenderness.  Musculoskeletal: Normal range of motion.  Neurological: She is alert.  Skin: Skin is warm and dry. No erythema.  Psychiatric: She has a normal mood and affect. Her behavior is normal. Judgment and thought content normal.    Vitals:   08/06/16  1228  BP: 132/80  Pulse: 83  Resp: 16  Temp: 97.9 F (36.6 C)   Assessment & Plan:  1. Acute cystitis with hematuria - POCT Microscopic Urinalysis ([facility]) - POCT urinalysis dipstick - Urine culture 2. Flu vaccine need - Flu Vaccine QUAD 36+ mos IM  Patient presents for evaluation of possible urinary tract infection. Microscopic/dipstick urinalysis were negative for nitrates, positive for large hematuria, proteinuria, and leukocytes. Patient is symptomatic and reports recent treatment of urinary tract infection 1 month prior. Patient reports abdominal sensitivity to multiple antibiotics and reports treatment with Ceftin for UTI have been effective previously.  Plan: . cefUROXime (CEFTIN) 500 MG tablet    Sig: Take 1 tablet (500 mg total) by mouth 2 (two) times daily with a meal.   Return for urine recheck in 15 days for re-evaluation of proteinuria and hematuria. If persists, referral to a urologist locally indicated.   Carroll Sage. [name], MSN, FNP-C [facility]

## 2016-08-06 NOTE — Patient Instructions (Signed)
Return for follow-up in 15 days after antibiotic is completed for urine recheck.   IF you received an x-ray today, you will receive an invoice from [facility] Radiology. Please contact [facility] Radiology at [phone] with questions or concerns regarding your invoice.   IF you received labwork today, you will receive an invoice from [employer]. Please contact [facility] at [phone] with questions or concerns regarding your invoice.   Our billing staff will not be able to assist you with questions regarding bills from these companies.  You will be contacted with the lab results as soon as they are available. The fastest way to get your results is to activate your My Chart account. Instructions are located on the last page of this paperwork. If you have not heard from [place] regarding the results in 2 weeks, please contact this office.     Urinary Tract Infection Urinary tract infections (UTIs) can develop anywhere along your urinary tract. Your urinary tract is your body's drainage system for removing wastes and extra water. Your urinary tract includes two kidneys, two ureters, a bladder, and a urethra. Your kidneys are a pair of bean-shaped organs. Each kidney is about the size of your fist. They are located below your ribs, one on each side of your spine. CAUSES Infections are caused by microbes, which are microscopic organisms, including fungi, viruses, and bacteria. These organisms are so small that they can only be seen through a microscope. Bacteria are the microbes that most commonly cause UTIs. SYMPTOMS  Symptoms of UTIs may vary by age and gender of the patient and by the location of the infection. Symptoms in young women typically include a frequent and intense urge to urinate and a painful, burning feeling in the bladder or urethra during urination. Older women and men are more likely to be tired, shaky, and weak and have muscle aches and abdominal pain. A  fever may mean the infection is in your kidneys. Other symptoms of a kidney infection include pain in your back or sides below the ribs, nausea, and vomiting. DIAGNOSIS To diagnose a UTI, your caregiver will ask you about your symptoms. Your caregiver will also ask you to provide a urine sample. The urine sample will be tested for bacteria and white blood cells. White blood cells are made by your body to help fight infection. TREATMENT  Typically, UTIs can be treated with medication. Because most UTIs are caused by a bacterial infection, they usually can be treated with the use of antibiotics. The choice of antibiotic and length of treatment depend on your symptoms and the type of bacteria causing your infection. HOME CARE INSTRUCTIONS  If you were prescribed antibiotics, take them exactly as your caregiver instructs you. Finish the medication even if you feel better after you have only taken some of the medication.  Drink enough water and fluids to keep your urine clear or pale yellow.  Avoid caffeine, tea, and carbonated beverages. They tend to irritate your bladder.  Empty your bladder often. Avoid holding urine for long periods of time.  Empty your bladder before and after sexual intercourse.  After a bowel movement, women should cleanse from front to back. Use each tissue only once. SEEK MEDICAL CARE IF:   You have back pain.  You develop a fever.  Your symptoms do not begin to resolve within 3 days. SEEK IMMEDIATE MEDICAL CARE IF:   You have severe back pain or lower abdominal pain.  You develop chills.  You have  nausea or vomiting.  You have continued burning or discomfort with urination. MAKE SURE YOU:   Understand these instructions.  Will watch your condition.  Will get help right away if you are not doing well or get worse.   This information is not intended to replace advice given to you by your health care provider. Make sure you discuss any questions you have  with your health care provider.   Document Released: 08/08/2005 Document Revised: 07/20/2015 Document Reviewed: 12/07/2011 Elsevier Interactive Patient Education [employer].

## 2016-08-08 LAB — URINE CULTURE

## 2016-08-20 ENCOUNTER — Ambulatory Visit (INDEPENDENT_AMBULATORY_CARE_PROVIDER_SITE_OTHER): Payer: Medicare Other | Admitting: Family Medicine

## 2016-08-20 VITALS — BP 122/76 | HR 74 | Temp 98.0°F | Resp 18 | Ht 62.0 in | Wt 146.0 lb

## 2016-08-20 DIAGNOSIS — N3001 Acute cystitis with hematuria: Secondary | ICD-10-CM

## 2016-08-20 LAB — POCT URINALYSIS DIP (MANUAL ENTRY)
BILIRUBIN UA: NEGATIVE
Blood, UA: NEGATIVE
GLUCOSE UA: NEGATIVE
Ketones, POC UA: NEGATIVE
Leukocytes, UA: NEGATIVE
NITRITE UA: NEGATIVE
Protein Ur, POC: NEGATIVE
Spec Grav, UA: 1.02
Urobilinogen, UA: 0.2
pH, UA: 7

## 2016-08-20 LAB — POC MICROSCOPIC URINALYSIS (UMFC): MUCUS RE: ABSENT

## 2016-08-20 NOTE — Progress Notes (Signed)
Urine recheck only visit:  Patient is here for labs only visit to recheck urine. Urine results indicate acute cystitis has resolved with prescribed antibiotic therapy.  Patient is followed by urology in [facility], her home.  Results for orders placed or performed in visit on 08/20/16  POCT urinalysis dipstick  Result Value Ref Range   Color, UA yellow yellow   Clarity, UA clear clear   Glucose, UA negative negative   Bilirubin, UA negative negative   Ketones, POC UA negative negative   Spec Grav, UA 1.020    Blood, UA negative negative   pH, UA 7.0    Protein Ur, POC negative negative   Urobilinogen, UA 0.2    Nitrite, UA Negative Negative   Leukocytes, UA Negative Negative  POCT Microscopic Urinalysis ([facility])  Result Value Ref Range   WBC,UR,HPF,POC None None WBC/hpf   RBC,UR,HPF,POC None None RBC/hpf   Bacteria None None, Too numerous to count   Mucus Absent Absent   Epithelial Cells, UR Per Microscopy Few (A) None, Too numerous to count cells/hpf   Vitals:   08/20/16 0954  BP: 122/76  Pulse: 74  Resp: 18  Temp: 98 F (36.7 C)   Follow-up with urologist.  Carroll Sage. [name], MSN, FNP-C [facility]

## 2016-08-20 NOTE — Patient Instructions (Addendum)
You urine is clear today.  Please follow-up with your urologist upon returning back [place]. Nice meeting you!  Miranda Elliott. [name], MSN, FNP-C [facility]    IF you received an x-ray today, you will receive an invoice from [facility] Radiology. Please contact [facility] Radiology at [phone] with questions or concerns regarding your invoice.   IF you received labwork today, you will receive an invoice from [employer]. Please contact [facility] at [phone] with questions or concerns regarding your invoice.   Our billing staff will not be able to assist you with questions regarding bills from these companies.  You will be contacted with the lab results as soon as they are available. The fastest way to get your results is to activate your My Chart account. Instructions are located on the last page of this paperwork. If you have not heard from [place] regarding the results in 2 weeks, please contact this office.

## 2017-04-06 ENCOUNTER — Encounter: Payer: Self-pay | Admitting: Physician Assistant

## 2017-04-06 ENCOUNTER — Ambulatory Visit (INDEPENDENT_AMBULATORY_CARE_PROVIDER_SITE_OTHER): Payer: Medicare Other | Admitting: Physician Assistant

## 2017-04-06 ENCOUNTER — Telehealth: Payer: Self-pay | Admitting: Radiology

## 2017-04-06 ENCOUNTER — Ambulatory Visit (HOSPITAL_COMMUNITY)
Admission: RE | Admit: 2017-04-06 | Discharge: 2017-04-06 | Disposition: A | Discharge status: Home / Self Care | Payer: Medicare Other | Source: Ambulatory Visit | Attending: Physician Assistant | Admitting: Physician Assistant

## 2017-04-06 VITALS — BP 129/77 | HR 76 | Resp 14 | Ht 62.0 in | Wt 149.0 lb

## 2017-04-06 DIAGNOSIS — K5732 Diverticulitis of large intestine without perforation or abscess without bleeding: Secondary | ICD-10-CM | POA: Insufficient documentation

## 2017-04-06 DIAGNOSIS — M4316 Spondylolisthesis, lumbar region: Secondary | ICD-10-CM | POA: Insufficient documentation

## 2017-04-06 DIAGNOSIS — R10814 Left lower quadrant abdominal tenderness: Secondary | ICD-10-CM

## 2017-04-06 DIAGNOSIS — R1030 Lower abdominal pain, unspecified: Secondary | ICD-10-CM | POA: Diagnosis not present

## 2017-04-06 DIAGNOSIS — I700 Atherosclerosis of aorta: Secondary | ICD-10-CM | POA: Diagnosis not present

## 2017-04-06 DIAGNOSIS — K5730 Diverticulosis of large intestine without perforation or abscess without bleeding: Secondary | ICD-10-CM | POA: Diagnosis not present

## 2017-04-06 LAB — POCT CBC
Granulocyte percent: 71.9 %G (ref 37–80)
HCT, POC: 39 % (ref 37.7–47.9)
Hemoglobin: 13.5 g/dL (ref 12.2–16.2)
LYMPH, POC: 2 (ref 0.6–3.4)
MCH, POC: 31.4 pg — AB (ref 27–31.2)
MCHC: 34.5 g/dL (ref 31.8–35.4)
MCV: 90.9 fL (ref 80–97)
MID (cbc): 0.8 (ref 0–0.9)
MPV: 8.5 fL (ref 0–99.8)
POC Granulocyte: 7.4 — AB (ref 2–6.9)
POC LYMPH PERCENT: 19.9 %L (ref 10–50)
POC MID %: 8.2 %M (ref 0–12)
Platelet Count, POC: 141 10*3/uL — AB (ref 142–424)
RBC: 4.29 M/uL (ref 4.04–5.48)
RDW, POC: 14.6 %
WBC: 10.3 10*3/uL — AB (ref 4.6–10.2)

## 2017-04-06 LAB — POCT URINALYSIS DIP (MANUAL ENTRY)
Bilirubin, UA: NEGATIVE
GLUCOSE UA: NEGATIVE mg/dL
Nitrite, UA: NEGATIVE
Protein Ur, POC: 30 mg/dL — AB
SPEC GRAV UA: 1.015 (ref 1.010–1.025)
Urobilinogen, UA: 0.2 E.U./dL
pH, UA: 5.5 (ref 5.0–8.0)

## 2017-04-06 LAB — POC MICROSCOPIC URINALYSIS (UMFC): Mucus: ABSENT

## 2017-04-06 LAB — POCT I-STAT CREATININE: CREATININE: 1 mg/dL (ref 0.44–1.00)

## 2017-04-06 MED ORDER — IOPAMIDOL (ISOVUE-300) INJECTION 61%
100.0000 mL | Freq: Once | INTRAVENOUS | Status: AC | PRN
  Administered 2017-04-06: 100 mL via INTRAVENOUS

## 2017-04-06 MED ORDER — ONDANSETRON HCL 4 MG PO TABS: 4.0000 mg | ORAL_TABLET | Freq: Three times a day (TID) | ORAL | 0 refills | Status: AC | PRN

## 2017-04-06 MED ORDER — AMOXICILLIN-POT CLAVULANATE 875-125 MG PO TABS: 1.0000 | ORAL_TABLET | Freq: Two times a day (BID) | ORAL | 0 refills | Status: AC

## 2017-04-06 NOTE — Telephone Encounter (Signed)
I have pulled CT scan report for Philis Fendt PA C since our phones are not working, and he has called patient with a report of the CT scan from today.

## 2017-04-06 NOTE — Progress Notes (Signed)
04/07/2017 10:03 AM   DOB: 1946/02/05 / MRN: [id number]  SUBJECTIVE:  Miranda Elliott is a 71 y.o. female with a history of diverticulosis presenting for lower abdominal pain. The pain is cramping in nature and severe. The pain is not going away. Denies any new medications in the last month.  She defecating roughly 5-6 times daily and she denies blood in the stool.  Denies antibiotics in the last 6 weeks. She denies vaginal bleeding and abnormal vaginal discharge. She has a history of chronic abdominal pain and the differential has been narrowed to IBD and IBS.  She has a history of frequent UTI and [place]. She is somewhat nauseas but no emesis, SOB, DOE. No new leg swelling. No cough. Was able to do piliates yesterday. She is not taking any laxatives.   She has a history of benign brain mass, breast cancer in remission, and aortic surgery.    [phone] is the best number to reach husband.   Depression screen PHQ 2/9 08/20/2016  Decreased Interest 0  Down, Depressed, Hopeless 0  PHQ - 2 Score 0     She is allergic to ciprofloxacin; dexamethasone; gabapentin; hydrocodone-acetaminophen; hydromorphone; levofloxacin; oxycodone; sulfa antibiotics; tramadol; and levaquin [levofloxacin in d5w].   She  has a past medical history of Asthma; Cancer ([place]); Hypertension; and Thyroid disease.    She  reports that she has never smoked. She has never used smokeless tobacco. She reports that she does not drink alcohol or use drugs. She  reports that she does not engage in sexual activity. The patient  has a past surgical history that includes Appendectomy; Brain surgery; Breast surgery; Tubal ligation; and Cardiac valve replacement.  Her family history includes Cancer in her mother; Heart disease in her mother; Hypertension in her father; Stroke in her father.  Review of Systems  Constitutional: Negative for chills and fever.  Eyes: Negative.   Respiratory: Negative for cough.    Cardiovascular: Negative for chest pain and leg swelling.  Gastrointestinal: Positive for abdominal pain, constipation and diarrhea.  Genitourinary: Negative for dysuria, flank pain, frequency, hematuria and urgency.  Musculoskeletal: Negative for falls.  Skin: Negative for itching and rash.  Neurological: Negative for dizziness.  Endo/Heme/Allergies: Negative for polydipsia.  Psychiatric/Behavioral: Negative for depression.    The problem list and medications were reviewed and updated by myself where necessary and exist elsewhere in the encounter.   OBJECTIVE:  BP 129/77   Pulse 76   Resp 14   Ht 5' 2" (1.575 m)   Wt 149 lb (67.6 kg)   BMI 27.25 kg/m   Physical Exam  Constitutional: She is oriented to person, place, and time. She is active.  Non-toxic appearance.  HENT:  Right Ear: Hearing, tympanic membrane, external ear and ear canal normal.  Left Ear: Hearing, tympanic membrane, external ear and ear canal normal.  Nose: Nose normal. Right sinus exhibits no maxillary sinus tenderness and no frontal sinus tenderness. Left sinus exhibits no maxillary sinus tenderness and no frontal sinus tenderness.  Mouth/Throat: Uvula is midline, oropharynx is clear and moist and mucous membranes are normal. Mucous membranes are not dry. No oropharyngeal exudate, posterior oropharyngeal edema or tonsillar abscesses.  Eyes: EOM are normal. Pupils are equal, round, and reactive to light.  Cardiovascular: Normal rate, regular rhythm, S1 normal, S2 normal, normal heart sounds and intact distal pulses.  Exam reveals no gallop, no friction rub and no decreased pulses.   No murmur heard. Pulmonary/Chest:  Effort normal. No stridor. No tachypnea. No respiratory distress. She has no wheezes. She has no rales.  Abdominal: Soft. Normal appearance and bowel sounds are normal. She exhibits no distension and no mass. There is tenderness (left lower quadrant). There is guarding. There is no rigidity, no rebound  and no CVA tenderness.  Musculoskeletal: Normal range of motion. She exhibits no edema.  Lymphadenopathy:       Head (right side): No submandibular and no tonsillar adenopathy present.       Head (left side): No submandibular and no tonsillar adenopathy present.    She has no cervical adenopathy.  Neurological: She is alert and oriented to person, place, and time. She has normal strength. She is not disoriented. She displays no atrophy and normal reflexes. No cranial nerve deficit or sensory deficit. She exhibits normal muscle tone. Coordination and gait normal.  Skin: Skin is warm and dry. She is not diaphoretic. No pallor.  Psychiatric: Her behavior is normal.    Results for orders placed or performed in visit on 04/06/17 (from the past 72 hour(s))  POCT CBC     Status: Abnormal   Collection Time: 04/06/17 12:24 PM  Result Value Ref Range   WBC 10.3 (A) 4.6 - 10.2 K/uL   Lymph, poc 2.0 0.6 - 3.4   POC LYMPH PERCENT 19.9 10 - 50 %L   MID (cbc) 0.8 0 - 0.9   POC MID % 8.2 0 - 12 %M   POC Granulocyte 7.4 (A) 2 - 6.9   Granulocyte percent 71.9 37 - 80 %G   RBC 4.29 4.04 - 5.48 M/uL   Hemoglobin 13.5 12.2 - 16.2 g/dL   HCT, POC 39.0 37.7 - 47.9 %   MCV 90.9 80 - 97 fL   MCH, POC 31.4 (A) 27 - 31.2 pg   MCHC 34.5 31.8 - 35.4 g/dL   RDW, POC 14.6 %   Platelet Count, POC 141 (A) 142 - 424 K/uL   MPV 8.5 0 - 99.8 fL  POCT urinalysis dipstick     Status: Abnormal   Collection Time: 04/06/17 12:49 PM  Result Value Ref Range   Color, UA yellow yellow   Clarity, UA clear clear   Glucose, UA negative negative mg/dL   Bilirubin, UA negative negative   Ketones, POC UA small (15) (A) negative mg/dL   Spec Grav, UA 1.015 1.010 - 1.025   Blood, UA large (A) negative   pH, UA 5.5 5.0 - 8.0   Protein Ur, POC =30 (A) negative mg/dL   Urobilinogen, UA 0.2 0.2 or 1.0 E.U./dL   Nitrite, UA Negative Negative   Leukocytes, UA Small (1+) (A) Negative  POCT Microscopic Urinalysis ([facility])      Status: Abnormal   Collection Time: 04/06/17 12:59 PM  Result Value Ref Range   WBC,UR,HPF,POC Too numerous to count  (A) None WBC/hpf   RBC,UR,HPF,POC Moderate (A) None RBC/hpf   Bacteria Many (A) None, Too numerous to count   Mucus Absent Absent   Epithelial Cells, UR Per Microscopy Too numerous to count  None, Too numerous to count cells/hpf  CMP14+EGFR     Status: Abnormal   Collection Time: 04/06/17  1:27 PM  Result Value Ref Range   Glucose 104 (H) 65 - 99 mg/dL   BUN 17 8 - 27 mg/dL   Creatinine, Ser 1.08 (H) 0.57 - 1.00 mg/dL   GFR calc non Af Amer 53 (L) >59 mL/min/1.73   GFR calc Af Amer 61 >  59 mL/min/1.73   BUN/Creatinine Ratio 16 12 - 28   Sodium 141 134 - 144 mmol/L   Potassium 4.7 3.5 - 5.2 mmol/L   Chloride 102 96 - 106 mmol/L   CO2 24 18 - 29 mmol/L    Comment: **Effective April 22, 2017 Carbon Dioxide, Total**   reference interval will be changing to:              Age                  Female          Female      0 days   - 30 days         82 - 4        16 - 62     31 days   -  1 year         15 - 25        15 - 25      2 years  -  5 years        63 - 1        17 - 39      6 years  - 12 years        79 - 58        19 - 79                >12 years        30 - 48        20 - 29    Calcium 9.8 8.7 - 10.3 mg/dL   Total Protein 6.2 6.0 - 8.5 g/dL   Albumin 3.8 3.6 - 4.8 g/dL   Globulin, Total 2.4 1.5 - 4.5 g/dL   Albumin/Globulin Ratio 1.6 1.2 - 2.2   Bilirubin Total 0.6 0.0 - 1.2 mg/dL   Alkaline Phosphatase 76 39 - 117 IU/L   AST 19 0 - 40 IU/L   ALT 16 0 - 32 IU/L   Lab Results  Component Value Date   CREATININE 1.00 04/06/2017     CBC Latest Ref Rng & Units 04/06/2017 12/12/2014 06/15/2008  WBC 4.6 - 10.2 K/uL 10.3(A) 7.3 7.5  Hemoglobin 12.2 - 16.2 g/dL 13.5 13.8 12.7  Hematocrit 37.7 - 47.9 % 39.0 41.9 37.4  Platelets 150 - 400 K/uL - 154 142(L)   Ct Abdomen Pelvis W Contrast  Result Date: 04/06/2017 CLINICAL DATA:  Lower abdominal pain, severe and  cramping and not resolving, history of diverticulosis EXAM: CT ABDOMEN AND PELVIS WITH CONTRAST TECHNIQUE: Multidetector CT imaging of the abdomen and pelvis was performed using the standard protocol following bolus administration of intravenous contrast. Sagittal and coronal MPR images reconstructed from axial data set. CONTRAST:  129m ISOVUE-300 IOPAMIDOL (ISOVUE-300) INJECTION 61% IV. Dilute oral contrast. COMPARISON:  None FINDINGS: Lower chest: Lung bases clear Hepatobiliary: Small hepatic cyst 9 mm diameter adjacent to gallbladder fossa. Gallbladder and liver otherwise unremarkable. Pancreas: Question small lipoma within the pancreatic head/ uncinate 10 x 8 x 7 mm in size. Remainder of pancreas normal appearance Spleen: Normal appearance Adrenals/Urinary Tract: Adrenal glands normal appearance. Probable small LEFT renal cysts. Patchy areas within the nephrograms bilaterally suspicious for pyelonephritis. No additional renal mass, hydronephrosis, or hydroureter. Bladder unremarkable. Stomach/Bowel: Appendix surgically absent. Wall thickening of the proximal transverse colon with pericolic inflammatory changes and note of multiple colonic diverticula compatible with acute diverticulitis. No evidence of perforation or abscess. Colonic diverticula are seen throughout  remainder of the transverse, descending, and sigmoid colon. Stomach and bowel loops otherwise normal appearance. Vascular/Lymphatic: Atherosclerotic calcifications aorta and iliac arteries without aneurysm. No adenopathy. Post median sternotomy and AVR. Reproductive: Unremarkable uterus and adnexa Other: No free air or free fluid.  No hernia. Musculoskeletal: Osseous demineralization. Grade 3 anterolisthesis at L4-L5 with apparent vertebral body fusion. Bones demineralized. Degenerative facet disease changes lower lumbar spine. IMPRESSION: Acute diverticulitis of the transverse colon with marked wall thickening and pericolic inflammatory changes. No  evidence of perforation or abscess. Patchy BILATERAL nephrograms suspicious for pyelonephritis; recommend correlation with urinalysis. Aortic atherosclerosis. Grade 3 anterolisthesis L4-L5 with apparent vertebral body fusion. Question small lipoma within the pancreatic head/uncinate 10 mm greatest size. Electronically Signed   By: Lavonia Dana M.D.   On: 04/06/2017 15:28    ASSESSMENT AND PLAN:  Desaray was seen today for abdominal pain.  Diagnoses and all orders for this visit:  Lower abdominal pain: Urine showing a likely infection.  I do question this however given TNC epi.  Given history of diverticulosis and LLQ tenderness she likely has early diverticulitis or an IBD manifestation.  Will scan the abdomen and pelvis now.  If positive for diverticulitis will start Augmentin as she tells me that she has never had an anaphylactic reaction to penicillins.  -     POCT urinalysis dipstick -     POCT CBC -     POCT Microscopic Urinalysis ([facility]) -     Urine culture -     I-Stat Creatinine  Left lower quadrant abdominal tenderness without rebound tenderness -     CMP14+EGFR -     Cancel: CT Abdomen Pelvis W Contrast; Future -     CT Abdomen Pelvis W Contrast; Future -     I-Stat Creatinine  Diverticulosis of colon without hemorrhage: She has diverticulitis.  Augmentin should cover for the pyelonephritis as well. She initially told me she had an allergic reaction to penicillin, but denies lip, throat or tongue swelling or rash however she can take cephalosporins. Advised that we need to try the augmentin as this may keep her out of the hospital.    Comments: Per colonoscopy in care everywhere.     The patient is advised to call or return to clinic if she does not see an improvement in symptoms, or to seek the care of the closest emergency department if she worsens with the above plan.   Philis Fendt, MHS, PA-C [facility] 04/07/2017 10:03 AM    Addendum: Damaris Schooner with patient via phone at roughly 4:50 pm on the 26th.  She had taken augmentin PO x 1 along with a zofran and was not complaining of rash, lip, throat, tongue swelling.  She took zofran along with.  Will follow up with her tomorrow. Philis Fendt, MS, PA-C 10:05 AM, 04/07/2017 '

## 2017-04-06 NOTE — Patient Instructions (Addendum)
  Go now to [facility] for your CT scan Drink contrast #1 now, contrast #2 in one hour  You will register in the Emergency room since admissions is closed, tell them you are there for a CT scan.    IF you received an x-ray today, you will receive an invoice from [facility]. Please contact [facility] at [phone] with questions or concerns regarding your invoice.   IF you received labwork today, you will receive an invoice from [facility]. Please contact [facility] at [phone] with questions or concerns regarding your invoice.   Our billing staff will not be able to assist you with questions regarding bills from these companies.  You will be contacted with the lab results as soon as they are available. The fastest way to get your results is to activate your My Chart account. Instructions are located on the last page of this paperwork. If you have not heard from [place] regarding the results in 2 weeks, please contact this office.

## 2017-04-07 LAB — CMP14+EGFR
A/G RATIO: 1.6 (ref 1.2–2.2)
ALT: 16 IU/L (ref 0–32)
AST: 19 IU/L (ref 0–40)
Albumin: 3.8 g/dL (ref 3.6–4.8)
Alkaline Phosphatase: 76 IU/L (ref 39–117)
BILIRUBIN TOTAL: 0.6 mg/dL (ref 0.0–1.2)
BUN/Creatinine Ratio: 16 (ref 12–28)
BUN: 17 mg/dL (ref 8–27)
CHLORIDE: 102 mmol/L (ref 96–106)
CO2: 24 mmol/L (ref 18–29)
Calcium: 9.8 mg/dL (ref 8.7–10.3)
Creatinine, Ser: 1.08 mg/dL — ABNORMAL HIGH (ref 0.57–1.00)
GFR calc Af Amer: 61 mL/min/{1.73_m2} (ref >59)
GFR calc non Af Amer: 53 mL/min/{1.73_m2} — ABNORMAL LOW (ref >59)
Globulin, Total: 2.4 g/dL (ref 1.5–4.5)
Glucose: 104 mg/dL — ABNORMAL HIGH (ref 65–99)
POTASSIUM: 4.7 mmol/L (ref 3.5–5.2)
Sodium: 141 mmol/L (ref 134–144)
Total Protein: 6.2 g/dL (ref 6.0–8.5)

## 2017-04-07 LAB — URINE CULTURE

## 2017-04-09 ENCOUNTER — Ambulatory Visit (INDEPENDENT_AMBULATORY_CARE_PROVIDER_SITE_OTHER): Payer: Medicare Other | Admitting: Physician Assistant

## 2017-04-09 ENCOUNTER — Telehealth: Payer: Self-pay | Admitting: Family Medicine

## 2017-04-09 ENCOUNTER — Encounter: Payer: Self-pay | Admitting: Physician Assistant

## 2017-04-09 VITALS — BP 132/83 | HR 69 | Temp 98.0°F | Resp 16 | Ht 62.0 in | Wt 147.6 lb

## 2017-04-09 DIAGNOSIS — N10 Acute tubulo-interstitial nephritis: Secondary | ICD-10-CM | POA: Diagnosis not present

## 2017-04-09 DIAGNOSIS — K5732 Diverticulitis of large intestine without perforation or abscess without bleeding: Secondary | ICD-10-CM | POA: Diagnosis not present

## 2017-04-09 DIAGNOSIS — R1909 Other intra-abdominal and pelvic swelling, mass and lump: Secondary | ICD-10-CM

## 2017-04-09 LAB — POCT CBC
Granulocyte percent: 63 %G (ref 37–80)
HCT, POC: 40.8 % (ref 37.7–47.9)
Hemoglobin: 14.1 g/dL (ref 12.2–16.2)
LYMPH, POC: 1.3 (ref 0.6–3.4)
MCH: 31.6 pg — AB (ref 27–31.2)
MCHC: 34.7 g/dL (ref 31.8–35.4)
MCV: 91.1 fL (ref 80–97)
MID (CBC): 0.5 (ref 0–0.9)
MPV: 7.8 fL (ref 0–99.8)
PLATELET COUNT, POC: 194 10*3/uL (ref 142–424)
POC Granulocyte: 3 (ref 2–6.9)
POC LYMPH %: 27.4 % (ref 10–50)
POC MID %: 9.6 %M (ref 0–12)
RBC: 4.48 M/uL (ref 4.04–5.48)
RDW, POC: 13.8 %
WBC: 4.7 10*3/uL (ref 4.6–10.2)

## 2017-04-09 LAB — POCT URINALYSIS DIP (MANUAL ENTRY)
Bilirubin, UA: NEGATIVE
GLUCOSE UA: NEGATIVE mg/dL
Ketones, POC UA: NEGATIVE mg/dL
NITRITE UA: NEGATIVE
Spec Grav, UA: 1.015 (ref 1.010–1.025)
UROBILINOGEN UA: 0.2 U/dL
pH, UA: 5.5 (ref 5.0–8.0)

## 2017-04-09 NOTE — Telephone Encounter (Signed)
LMOM TO COME INTO OFFICE TODAY TO SEE MICHAEL CLARK FOR A F/U VISIT ON HER LABS MICHAEL WOULD LIKE A POC CBC POC URINE DIP PLEASE MAKE APPOINTMENT

## 2017-04-09 NOTE — Patient Instructions (Addendum)
Patient has been on Augmentin 875 bid since scan.   CBC Latest Ref Rng & Units 04/09/2017 04/06/2017 12/12/2014  WBC 4.6 - 10.2 K/uL 4.7 10.3(A) 7.3  Hemoglobin 12.2 - 16.2 g/dL 14.1 13.5 13.8  Hematocrit 37.7 - 47.9 % 40.8 39.0 41.9  Platelets 150 - 400 K/uL - - 154   Wt Readings from Last 3 Encounters:  04/09/17 147 lb 9.6 oz (67 kg)  04/06/17 149 lb (67.6 kg)  08/20/16 146 lb (66.2 kg)   Temp Readings from Last 3 Encounters:  04/09/17 98 F (36.7 C) (Oral)  08/20/16 98 F (36.7 C) (Oral)  08/06/16 97.9 F (36.6 C) (Oral)   BP Readings from Last 3 Encounters:  04/09/17 132/83  04/06/17 129/77  08/20/16 122/76   Pulse Readings from Last 3 Encounters:  04/09/17 69  04/06/17 76  08/20/16 74     FINDINGS: Lower chest: Lung bases clear  Hepatobiliary: Small hepatic cyst 9 mm diameter adjacent to gallbladder fossa. Gallbladder and liver otherwise unremarkable.  Pancreas: Question small lipoma within the pancreatic head/ uncinate 10 x 8 x 7 mm in size. Remainder of pancreas normal appearance  Spleen: Normal appearance  Adrenals/Urinary Tract: Adrenal glands normal appearance. Probable small LEFT renal cysts. Patchy areas within the nephrograms bilaterally suspicious for pyelonephritis. No additional renal mass, hydronephrosis, or hydroureter. Bladder unremarkable.  Stomach/Bowel: Appendix surgically absent. Wall thickening of the proximal transverse colon with pericolic inflammatory changes and note of multiple colonic diverticula compatible with acute diverticulitis. No evidence of perforation or abscess. Colonic diverticula are seen throughout remainder of the transverse, descending, and sigmoid colon. Stomach and bowel loops otherwise normal appearance.  Vascular/Lymphatic: Atherosclerotic calcifications aorta and iliac arteries without aneurysm. No adenopathy. Post median sternotomy and AVR.  Reproductive: Unremarkable uterus and adnexa  Other: No  free air or free fluid.  No hernia.  Musculoskeletal: Osseous demineralization. Grade 3 anterolisthesis at L4-L5 with apparent vertebral body fusion. Bones demineralized. Degenerative facet disease changes lower lumbar spine.  IMPRESSION: Acute diverticulitis of the transverse colon with marked wall thickening and pericolic inflammatory changes.  No evidence of perforation or abscess.  Patchy BILATERAL nephrograms suspicious for pyelonephritis; recommend correlation with urinalysis.  Aortic atherosclerosis.    IF you received an x-ray today, you will receive an invoice from [facility]. Please contact [facility] at [phone] with questions or concerns regarding your invoice.   IF you received labwork today, you will receive an invoice from [facility]. Please contact [facility] at [phone] with questions or concerns regarding your invoice.   Our billing staff will not be able to assist you with questions regarding bills from these companies.  You will be contacted with the lab results as soon as they are available. The fastest way to get your results is to activate your My Chart account. Instructions are located on the last page of this paperwork. If you have not heard from [place] regarding the results in 2 weeks, please contact this office.

## 2017-04-09 NOTE — Progress Notes (Signed)
04/09/2017 5:02 PM   DOB: Aug 29, 1946 / MRN: [id number]  SUBJECTIVE:  Miranda Elliott is a 71 y.o. female presenting for recheck of diverticulitis and pyelonephritis found on CT scan 3 days ago.  She is taking Augmentin 875 BID and has been getting HA with this, however however he abx allergies make this medication the only really possibility. Her husband tells me that she is feeling slightly better today.   She tells me that the pain has improved.    She is allergic to ciprofloxacin; dexamethasone; gabapentin; hydrocodone-acetaminophen; hydromorphone; levofloxacin; oxycodone; sulfa antibiotics; tramadol; and levaquin [levofloxacin in d5w].   She  has a past medical history of Asthma; Cancer ([place]); Hypertension; and Thyroid disease.    She  reports that she has never smoked. She has never used smokeless tobacco. She reports that she does not drink alcohol or use drugs. She  reports that she does not engage in sexual activity. The patient  has a past surgical history that includes Appendectomy; Brain surgery; Breast surgery; Tubal ligation; and Cardiac valve replacement.  Her family history includes Cancer in her mother; Heart disease in her mother; Hypertension in her father; Stroke in her father.  Review of Systems  Constitutional: Negative for chills, diaphoresis and fever.  Respiratory: Negative for cough, hemoptysis, sputum production, shortness of breath and wheezing.   Cardiovascular: Negative for chest pain, orthopnea and leg swelling.  Gastrointestinal: Negative for abdominal pain, blood in stool, constipation, diarrhea, heartburn, melena, nausea and vomiting.  Genitourinary: Negative for flank pain.  Skin: Negative for rash.  Neurological: Negative for dizziness.    The problem list and medications were reviewed and updated by myself where necessary and exist elsewhere in the encounter.   OBJECTIVE:  BP 132/83 (BP Location: Left Arm, Patient Position: Sitting, Cuff Size: Normal)    Pulse 69   Temp 98 F (36.7 C) (Oral)   Resp 16   Ht '5\' 2"'  (1.575 m)   Wt 147 lb 9.6 oz (67 kg)   SpO2 94%   BMI 27.00 kg/m   Physical Exam  Constitutional: She is active.  Non-toxic appearance.  Cardiovascular: Normal rate, regular rhythm, S1 normal, S2 normal, normal heart sounds and intact distal pulses.  Exam reveals no gallop, no friction rub and no decreased pulses.   No murmur heard. Pulmonary/Chest: Effort normal. No tachypnea. She has no rales.  Abdominal: Soft. Normal appearance and bowel sounds are normal. She exhibits no distension and no mass. There is no tenderness. There is no rigidity, no rebound, no guarding and no CVA tenderness.  Musculoskeletal: She exhibits no edema.  Neurological: She is alert.  Skin: Skin is warm and dry. She is not diaphoretic. No pallor.    Results for orders placed or performed in visit on 04/09/17 (from the past 72 hour(s))  POCT CBC     Status: Abnormal   Collection Time: 04/09/17  3:02 PM  Result Value Ref Range   WBC 4.7 4.6 - 10.2 K/uL   Lymph, poc 1.3 0.6 - 3.4   POC LYMPH PERCENT 27.4 10 - 50 %L   MID (cbc) 0.5 0 - 0.9   POC MID % 9.6 0 - 12 %M   POC Granulocyte 3.0 2 - 6.9   Granulocyte percent 63.0 37 - 80 %G   RBC 4.48 4.04 - 5.48 M/uL   Hemoglobin 14.1 12.2 - 16.2 g/dL   HCT, POC 40.8 37.7 - 47.9 %   MCV 91.1 80 - 97 fL   MCH,  POC 31.6 (A) 27 - 31.2 pg   MCHC 34.7 31.8 - 35.4 g/dL   RDW, POC 13.8 %   Platelet Count, POC 194 142 - 424 K/uL   MPV 7.8 0 - 99.8 fL  POCT urinalysis dipstick     Status: Abnormal   Collection Time: 04/09/17  3:04 PM  Result Value Ref Range   Color, UA yellow yellow   Clarity, UA clear clear   Glucose, UA negative negative mg/dL   Bilirubin, UA negative negative   Ketones, POC UA negative negative mg/dL   Spec Grav, UA 1.015 1.010 - 1.025   Blood, UA moderate (A) negative   pH, UA 5.5 5.0 - 8.0   Protein Ur, POC trace (A) negative mg/dL   Urobilinogen, UA 0.2 0.2 or 1.0 E.U./dL    Nitrite, UA Negative Negative   Leukocytes, UA Small (1+) (A) Negative   Orthostatic VS for the past 24 hrs:  BP- Lying Pulse- Lying BP- Sitting Pulse- Sitting BP- Standing at 0 minutes Pulse- Standing at 0 minutes  04/09/17 1454 108/74 66 123/85 73 116/80 70   Lab Results  Component Value Date   CREATININE 1.00 04/06/2017   BUN 17 04/06/2017   NA 141 04/06/2017   K 4.7 04/06/2017   CL 102 04/06/2017   CO2 24 04/06/2017       No results found.  ASSESSMENT AND PLAN:  [name] was seen today for follow-up.  Diagnoses and all orders for this visit:  Diverticulitis of colon: Pulse and white count down.  Abdomen less tender today and there is no guarding.  No flank tenderness.  Some blood in the urine and I will follow this with a culture, however this may be 2/2 resolving infection.   She will continue augmentin.  -     POCT CBC  Acute pyelonephritis -     Orthostatic vital signs -     POCT urinalysis dipstick -     Urine culture -     CMP14+EGFR -     Lipase -     Amylase  Abdominal mass of other site: To further caracterize the pancreatic lesion. This will also give [place] some closure on the diverticulitis and pyelonephritis called on the CT scan.  I'd like this scan after her abx course so in about 10-12 days.  -     MR Abdomen W Wo Contrast; Future    The patient is advised to call or return to clinic if she does not see an improvement in symptoms, or to seek the care of the closest emergency department if she worsens with the above plan.   Philis Fendt, MHS, PA-C [facility] 04/09/2017 5:02 PM

## 2017-04-09 NOTE — Progress Notes (Signed)
Miranda Elliott 

## 2017-04-10 LAB — CMP14+EGFR
ALK PHOS: 73 IU/L (ref 39–117)
ALT: 23 IU/L (ref 0–32)
AST: 26 IU/L (ref 0–40)
Albumin/Globulin Ratio: 1.5 (ref 1.2–2.2)
Albumin: 4 g/dL (ref 3.6–4.8)
BUN/Creatinine Ratio: 16 (ref 12–28)
BUN: 17 mg/dL (ref 8–27)
Bilirubin Total: 0.3 mg/dL (ref 0.0–1.2)
CO2: 30 mmol/L — AB (ref 18–29)
CREATININE: 1.08 mg/dL — AB (ref 0.57–1.00)
Calcium: 10 mg/dL (ref 8.7–10.3)
Chloride: 98 mmol/L (ref 96–106)
GFR calc Af Amer: 61 mL/min/{1.73_m2} (ref >59)
GFR calc non Af Amer: 53 mL/min/{1.73_m2} — ABNORMAL LOW (ref >59)
GLOBULIN, TOTAL: 2.6 g/dL (ref 1.5–4.5)
GLUCOSE: 106 mg/dL — AB (ref 65–99)
Potassium: 4.3 mmol/L (ref 3.5–5.2)
SODIUM: 143 mmol/L (ref 134–144)
Total Protein: 6.6 g/dL (ref 6.0–8.5)

## 2017-04-10 LAB — URINE CULTURE

## 2017-04-16 NOTE — Progress Notes (Signed)
Any of these dates are fine Miranda Elliott.  Thank you for helping her get the scan. Philis Fendt, MS, PA-C 9:12 PM, 04/16/2017

## 2017-04-22 ENCOUNTER — Ambulatory Visit (HOSPITAL_COMMUNITY)
Admission: RE | Admit: 2017-04-22 | Discharge: 2017-04-22 | Disposition: A | Discharge status: Home / Self Care | Payer: Medicare Other | Source: Ambulatory Visit | Attending: Physician Assistant | Admitting: Physician Assistant

## 2017-04-22 ENCOUNTER — Other Ambulatory Visit: Payer: Self-pay | Admitting: Physician Assistant

## 2017-04-22 DIAGNOSIS — R1909 Other intra-abdominal and pelvic swelling, mass and lump: Secondary | ICD-10-CM

## 2017-04-22 DIAGNOSIS — D174 Benign lipomatous neoplasm of intrathoracic organs: Secondary | ICD-10-CM | POA: Diagnosis not present

## 2017-04-22 DIAGNOSIS — K862 Cyst of pancreas: Secondary | ICD-10-CM | POA: Diagnosis not present

## 2017-04-22 MED ORDER — GADOBENATE DIMEGLUMINE 529 MG/ML IV SOLN
15.0000 mL | Freq: Once | INTRAVENOUS | Status: AC | PRN
  Administered 2017-04-22: 13 mL via INTRAVENOUS

## 2017-08-12 ENCOUNTER — Ambulatory Visit: Payer: MEDICARE | Attending: Gastroenterology

## 2017-08-12 DIAGNOSIS — K5730 Diverticulosis of large intestine without perforation or abscess without bleeding: Secondary | ICD-10-CM

## 2017-08-12 NOTE — Patient Instructions
1.  Increase your metamucil to 1 TBSP once or twice daily.  Build up from 1 tsp every few days up to 1 TBSP.      2.  OK to continue your herbs and dietary recommendations as outlined.    3.  Florastor - 1-2 times daily     4.  Get Dr. Gavin Potters information on herbs    5.  Consider infectious disease consultation regarding antibiotic use in the future.

## 2017-08-17 DIAGNOSIS — R197 Diarrhea, unspecified: Secondary | ICD-10-CM

## 2017-11-14 ENCOUNTER — Ambulatory Visit: Payer: MEDICARE | Attending: Gastroenterology

## 2017-11-14 DIAGNOSIS — K219 Gastro-esophageal reflux disease without esophagitis: Secondary | ICD-10-CM

## 2017-11-14 DIAGNOSIS — K5732 Diverticulitis of large intestine without perforation or abscess without bleeding: Secondary | ICD-10-CM

## 2017-11-14 DIAGNOSIS — R1030 Lower abdominal pain, unspecified: Secondary | ICD-10-CM

## 2017-11-14 DIAGNOSIS — K5900 Constipation, unspecified: Secondary | ICD-10-CM

## 2017-11-14 MED ORDER — DEXLANSOPRAZOLE 60 MG PO CPDR
60 mg | ORAL_CAPSULE | Freq: Every day | ORAL | 3 refills | Status: AC
Start: 2017-11-14 — End: 2018-03-06

## 2017-11-14 MED ORDER — POLYETHYLENE GLYCOL 3350 17 GM/SCOOP PO POWD
3 refills | Status: AC
Start: 2017-11-14 — End: ?

## 2017-11-14 NOTE — Patient Instructions
1.  Stop your stool softener.    2.  Start Miralax - one half to one capful mixed in 6-8 oz of water once daily to help regulate your bowel habits.      3.  Dexlansoprazole - 60 mg - take one daily as an alternative to pantoprazole.  If you don't buy this, increase pantoprazole to twice daily 30 min before breakfast and dinner.      4.  Let Dr. Natavia Held know how you are feeling in two weeks or sooner if the pain worsens.

## 2017-11-14 NOTE — Progress Notes
PATIENTMahitha Crane  MRN: [id number]  DOB: 1946-05-05  DATE OF SERVICE: 11/13/2017    REFERRING PRACTITIONER: No ref. provider found  PRIMARY CARE PROVIDER: Linzie Collin, MD    GI FOLLOW UP    Subjective:     Chief Complaint:  Charlene Crane is a 72 y.o. female with history of recurrent diverticulitis and Breast CA who presents for followup.    Recurrent diverticulitis - first time in [id number] then again two weeks ago.  She has been eating high fiber foods and fiber supplements.    03/2017 - sudden onset LLQ pain which persisted.  No fever.  Seen in [place] in [place].  Had a CT - started on Augmentin - severe migraine - very sick from the antibiotic.  Given pain medication - tramadol with Zofran.  Took ABx for 10 days.  Had a follow up MRI after course.  Found a UTI and a kidney infection.  No symptoms.     06/2017 - recurrent LLQ pain.  Seen in [place] - treated with Augmentin.  Resolution of pain.    This Saturday night - same LLQ pain - fine on Sunday.  Had UTI - on Macrobid.      10 /2018 - Problems with diarrhea - for a couple of years.  Gets cramps, urgency then runs to the restroom - loose or formed then loose - can think finished but then gets another urge to move her bowels.  Hard to clean.  Incomplete evacuation.  Usually in the morning.  Can occur in the afternoon.  Hard to get out of the house.  Eats - trigger.  No blood in her stool except from wiping.  Started on chinese herbs - helped by bulking her stools and made less frequent.  Taking metamucil 1 tsp - stopped with recent diverticulitis.      Gets heartburn.   Random.  Taking protonix once daily in the morning.  Takes pepcid as needed.  Seen by Dr. Lisabeth Devoid in the past.      Last colonoscopy 2-3 years ago - had benign polyps and diverticulosis.      Sensitive to antibiotics and pain medications.  Needs Zofran with the pain medications and antibiotics. Gets migraines from antibiotics.  Gets nausea from pain medications. Got tendon problems from Cipro.  Macrobid isn't upsetting her stomach.      Feels low energy.  Taking a lot of supplements from her headache doctor, urologist and Dr. Phineas Real.  Taking Vitamin B12.    Seeing a dietitian.  Counseled on a high fiber diet.  Took a probiotic after the Augmentin.   No air or stool in her urine.  Followed by urology.  Urine clears after treatment.     REC  1.  Increase your metamucil to 1 TBSP once or twice daily.  Build up from 1 tsp every few days up to 1 TBSP.    2.  OK to continue your herbs and dietary recommendations as outlined.  3.  Florastor - 1-2 times daily   4.  Get Dr. Analysa Held information on herbs  5.  Consider infectious disease consultation regarding antibiotic use in the future.  6.  Consider colonoscopy sooner than next planned timing.  7.  Check serum ionized calcium, celiac testing.  8.  Stool testing for C.difficile, calprotectin and O/P    Interval history - stopped the herbs.  Started an injection monthly to prevent migraines.  Amovig.  Constipation can be a side effect.  Really helped  her headaches.  Stools became hard and hard to pass.  Had abdominal pain yesterday - had a small hard stool for a few days.  Had pain all day yesterday and this morning despite having two larger softer stools.  The pain is across her lower abdomen.  (Her diverticulitis pain usually migrates to the left side).  Very gassy, having more flatulence and indigestion/belching.  Nauseated after eating for the last two weeks.  Close to the end of the dosage course.  Belly feels hard and bloated.  Feels better with walking.  Taking metamucil.  No blood in her stool.    Still taking pantoprazole 40 mg before breakfast.  Getting heartburn after eating.  Takes pepcid with indigestion.  Had taken two in the past.    No fever.  No appetite today.    Trying to lose weight.  Hard to lose.    Nausea sudden onset.    Taking 200 mg daily.    Stopped depakote - hair thinning. Started [employer].      Past Medical History:    Migraines  Diverticulosis.    Aortic aneurysm  Breast CA -2014  Colon polyps  Duodenal ulcer - more than 20 years ago.   Sleep apnea     Past Surgical History:    AVR - bovine, aortic aneurysm repair  Meningioma   Appendectomy  Breast CA surgery    Family History:  Mother - colon polyps, Breast CA, ulcers.  No colon cancer.      Social History:  reports that she has never smoked. She has never used smokeless tobacco.;  Retired Programmer, systems.  Married.  Husband, Avi.      Allergies:   is allergic to ciprofloxacin; dexamethasone; gabapentin; hydrocodone-acetaminophen; hydromorphone; levofloxacin; oxycodone; penicillins; sulfa antibiotics; tramadol; and cephalexin.    Medications:  Current Outpatient Prescriptions   Medication Sig   ??? amlodipine-atorvastatin 2.5-10 MG per tablet Take 1 tablet by mouth daily.   ??? anastrozole 1 mg tablet Take 1 mg by mouth daily.   ??? aspirin 81 mg EC tablet Take 81 mg by mouth daily.   ??? atorvastatin 20 mg tablet Take 20 mg by mouth at bedtime.   ??? Boswellia Serrata (BOSWELLIA PO) Take by mouth.   ??? Calcium-Magnesium-Zinc 167-83-8 MG TABS Take by mouth.   ??? Cholecalciferol (VITAMIN D3) 3000 units TABS Take 5,000 Units by mouth.   ??? Coenzyme Q10 (CO Q-10) 300 MG CAPS Take by mouth.   ??? Cyanocobalamin (VITAMIN B 12 PO) Take by mouth.   ??? D-MANNOSE PO Take by mouth.   ??? divalproex 250 mg DR tablet Take 250 mg by mouth two (2) times daily.   ??? Efinaconazole (JUBLIA) 10 % SOLN external solution Apply topically.   ??? Misc Natural Products (ESTROVEN ENERGY PO) Take by mouth.   ??? Mometasone Furoate POWD by Does not apply route.   ??? pantoprazole 40 mg DR tablet Take 40 mg by mouth daily.   ??? progesterone (PROMETRIUM) 100 mg capsule Take 100 mg by mouth daily.   ??? riboflavin 100 mg tablet Take 100 mg by mouth daily.   ??? zolmitriptan (ZOMIG) 2.5 mg tablet Take 2.5 mg by mouth every two (2) hours as needed for Migraine. No current facility-administered medications for this visit.      Review of Systems:  A complete ROS was performed. Pertinent positives and negatives are in the HPI.  Remainder of 14 systems is negative.    Objective:     Physical Exam:  Vitals: BP (!) 156/94  ~ Pulse 81  ~ Temp 36.8 ???C (98.3 ???F)  ~ Ht 5' 2'' (1.575 m)  ~ Wt 143 lb (64.9 kg)  ~ BMI 26.16 kg/m???   Weight stable   Constitutional:WD/WN alert, appears stated age and cooperative   Head: Normocephalic, without obvious abnormality, atraumatic   Eyes: conjunctivae/corneas clear. PERRL, EOM's intact, anicteric.    Mouth: No perioral lesions.  Tongue is normal.  Mucous membranes moist.  No oral or posterior pharyngeal lesions   Neck: no adenopathy, supple, symmetrical, trachea midline and thyroid not enlarged, symmetric, no tenderness/mass/nodules   Back: symmetric, no curvature. ROM normal. No CVA tenderness.   Lungs: clear to auscultation bilaterally   Heart: regular rate and rhythm, S1, S2 normal, no murmur, click, rub or gallop   Abdomen: Abdomen is soft and flat, nontender with active bowel sounds. No rebound or guarding. No appreciable flank or shifting dullness. No appreciable hepatosplenomegaly. No umbilical hernia.   Rectal: deferred     Extremities: extremities normal, atraumatic, no cyanosis or edema   Skin: Skin color, texture, turgor normal. No rashes or lesions.  Scar on head. Pulses: 2+ and symmetric   Lymph Nodes: Cervical, supraclavicular, and axillary nodes normal.   Neurologic: awake, alert and oriented.     Lab Review:  Component      Latest Ref Rng & Units 09/17/2017 09/18/2017   Tissue Transglutaminase Ab, IGG ([facility])      U/mL 4    Tissue Transglutaminase Ab, IGA ([facility])      U/mL <1    Clostridium Difficile Toxin/GDH W/Refl To PCR ([facility])        SEE NOTE   Immunoglobulin A ([facility])      81 - 463 mg/dL 161    Calcium, Ionized ([facility])      4.8 - 5.6 mg/dL 5.4    Calprotectin, Stool ([facility])      mcg/g  16.0 Ova And Parasites, Stool Conc And Perm Smear ([facility])        SEE NOTE     O/P and C.diff - negative    07/2017 - normal CBC, plt, BMP, hepatic panel,  Calcium - 11.2  06/2017 - lipase - 18,     Imaging:  06/2017 - CT abdomen/pelvis  Significant focal wall thickening of the distal transverse colon  most likely secondary to acute diverticulitis. Underlying mass  cannot be completely excluded.  Status post appendectomy  Fatty infiltration of the liver    04/2017 - MRI abdomen  1. Previously described findings of acute bilateral pyelonephritis  have resolved .  2. Previously described findings of acute diverticulitis in the  transverse colon have resolved .  3. Subcentimeter unilocular cystic pancreatic body lesion without  high risk MRI features. Follow-up MRI abdomen without and with IV  contrast is recommended review 2 years until 10 year stability has  been demonstrated.    03/2017 - CT abdomen/pelvis  Acute diverticulitis of the transverse colon with marked wall  thickening and pericolic inflammatory changes.  No evidence of perforation or abscess.  Patchy BILATERAL nephrograms suspicious for pyelonephritis;  recommend correlation with urinalysis.  Aortic atherosclerosis.  Grade 3 anterolisthesis L4-L5 with apparent vertebral body fusion.  Question small lipoma within the pancreatic head/uncinate 10 mm  greatest size.    GI Studies:  11/2015 - Colonoscopy - severe angulation and looping of instrument in left colon, moderately severe diverticular disease most prominent in the left colon.  Sub-optimal prep - cleared with lavage.  Two polyps 6 mm and 2 mm, severe diverticular disease - throughout the colon, most prominent in left colon      Assessment :     1.  Diverticular disease with two episodes of diverticulitis in her transverse colon in the last six months.  No evidence on exam for acute diverticulitis.  Intolerance to multiple antibiotics.  Need plan going forward for regimen to use in case of acute diverticulitis.  Will refer to ID.    2.  Diarrhea and cramping - blood on wiping - rule out inflammatory, infectious (less likely), SCAD (diverticular colitis), obstructive.  Resolved.  Negative celiac and stool testing.  3.  Lower abdominal pain for two days - associated with constipation - will try to regulate bowel habits.  Constipation may be side effect of new migraine drug.    4.  History of hemorrhoids  5.  GERD - ongoing symptoms despite pantoprazole.  6.  High serum calcium - normal ionized calcium.  7.  Sensitivity to medications:  Migraines from antibiotics, nausea from pain medications.  8.  History of breast CA    Plan:     1.  Stop your stool softener.  2.  Start Miralax - one half to one capful mixed in 6-8 oz of water once daily to help regulate your bowel habits.    3.  Dexlansoprazole - 60 mg - take one daily as an alternative to pantoprazole.  If you don't buy this, increase pantoprazole to twice daily 30 min before breakfast and dinner.    4.  Let Dr. Adriahna Held know how you are feeling in two weeks or sooner if the pain worsens.    5.  Infectious disease consultation - regarding antibiotic choices for diverticulitis.      Greater than 50% of this 25 minute appointment was spent in face to face counseling, coordination of care and review of medical records.        Author: Koren Bound, MD 11/13/2017 11:57 PM

## 2017-11-19 ENCOUNTER — Ambulatory Visit: Payer: BLUE CROSS/BLUE SHIELD

## 2017-11-19 NOTE — Progress Notes
INFECTIOUS DISEASES CONSULTATION    Patient: Charlene Crane  MRN: [id number]  DOB: [birth year]  Date of Service: 11/19/2017  Requesting Physician: Docia Chuck., MD  Reason for Consultation: Antibiotic recommendations in setting of recurrent diverticulitis, multiple antibiotic intolerances    Chief Complaint: Recurrent diverticulitis    History of Present Illness: Charlene Crane is a 46 yoF w/ PMH significant for ascending aortic aneurysm s/p AVR, hx migraines, recurrent diverticulitis, and intolerance to several classes of antibiotics.  Referred to ID for recommendations in case of further episodes of diverticulitis.    First episode of diverticulitis occurred while pt was visiting [place], 03/2017; was seen in local ED and managed medically w/ course of augmentin. While taking augmentin developed severe, debilitating migraine which she attributes to the antibiotic. Had recurrence of LLQ pain in August 2018, CT evidence c/w sigmoid diverticulitis [facility]), again prescribed course of augmentin with same reaction (severe migraine).     Had intermittent episodes of LLQ pain and issues with diarrhea in October, now resolved; currently feels well, no fever, abd pain. Ongoing issues w/ constipation. Was started on amovig for migraines ~ 1 month ago which has helped in terms of frequency reduction.     Regarding antibiotic issues; does not have any true documented allergy but is intolerant to several classes of antibiotics.  Reports prior tendinopathy with levofloxacin; augmentin as noted above, severe debilitating migraine, no rash fevers, facial swelling, difficulty breathing. Cephalexin she cannot recall. Has sulfa listed as allergy, this was from childhood and she cannot recall any specific allergy.       Review of Systems:  A 14-point review of systems was performed and is negative except for as noted above.     Past Medical History:  Past Medical History:   Diagnosis Date   ??? Aneurysm (HCC/RAF)    ??? Diverticulitis ??? Diverticulosis         Past Surgical History:  Past Surgical History:   Procedure Laterality Date   ??? APPENDECTOMY     ??? BRAIN SURGERY     ??? BREAST LUMPECTOMY     ??? CARDIAC VALVE SURGERY     ??? COLONOSCOPY     ??? UPPER GASTROINTESTINAL ENDOSCOPY     AVR 2014    Allergies:   Allergies   Allergen Reactions   ??? Ciprofloxacin Other (See Comments)     Dizziness/Lightheadedness, Vomiting  Dizziness/Lightheadedness, Vomiting   ??? Dexamethasone Nausea And Vomiting   ??? Gabapentin Other (See Comments)     ''broke out into sweats''  ''broke out into sweats''   ??? Hydrocodone-Acetaminophen      Other reaction(s): Nausea Only  Was taking Norco and steroids after brain surgery (meningioma) and got extremely nauseated.   Dizziness/Lightheadedness, Vomiting  Other reaction(s): Nausea Only  Was taking Norco and steroids after brain surgery (meningioma) and got extremely nauseated.   Dizziness/Lightheadedness, Vomiting   ??? Hydromorphone Other (See Comments)     Dizziness/Lightheadedness, Vomiting  Other reaction(s): Other (See Comments)  Dizziness/Lightheadedness, Vomiting   ??? Levofloxacin Other (See Comments)     L ankle tendonitis after taking it  L ankle tendonitis after taking it   ??? Oxycodone Other (See Comments)     Dizziness/Lightheadedness, Vomiting  Dizziness/Lightheadedness, Vomiting   ??? Penicillins Other (See Comments)   ??? Sulfa Antibiotics Other (See Comments)     Unknown childhood   Other reaction(s): Unspecified  Unknown childhood    ??? Tramadol Nausea And Vomiting     Patient experienced nausea/vomiting while  taking tramadol.  She notes that she was given half dose of tramadol with zofran and tolerated well.  Patient experienced nausea/vomiting while taking tramadol.  She notes that she was given half dose of tramadol with zofran and tolerated well.   ??? Cephalexin Other (See Comments)       Medications:    Current Outpatient Prescriptions:   ???  amlodipine-atorvastatin 2.5-10 MG per tablet, Take 1 tablet by mouth daily., Disp: , Rfl:   ???  anastrozole 1 mg tablet, Take 1 mg by mouth daily., Disp: , Rfl:   ???  aspirin 81 mg EC tablet, Take 81 mg by mouth daily., Disp: , Rfl:   ???  atorvastatin 20 mg tablet, Take 20 mg by mouth at bedtime., Disp: , Rfl:   ???  Boswellia Serrata (BOSWELLIA PO), Take by mouth., Disp: , Rfl:   ???  Calcium-Magnesium-Zinc 167-83-8 MG TABS, Take by mouth., Disp: , Rfl:   ???  Cholecalciferol (VITAMIN D3) 3000 units TABS, Take 5,000 Units by mouth., Disp: , Rfl:   ???  Coenzyme Q10 (CO Q-10) 300 MG CAPS, Take by mouth., Disp: , Rfl:   ???  Cyanocobalamin (VITAMIN B 12 PO), Take by mouth., Disp: , Rfl:   ???  D-MANNOSE PO, Take by mouth., Disp: , Rfl:   ???  dexlansoprazole 60 mg DR capsule, Take 1 capsule (60 mg total) by mouth daily., Disp: 30 capsule, Rfl: 3  ???  divalproex 250 mg DR tablet, Take 250 mg by mouth two (2) times daily., Disp: , Rfl:   ???  docusate 100 mg capsule, Take 100 mg by mouth two (2) times daily., Disp: , Rfl:   ???  Efinaconazole (JUBLIA) 10 % SOLN external solution, Apply topically., Disp: , Rfl:   ???  Misc Natural Products (ESTROVEN ENERGY PO), Take by mouth., Disp: , Rfl:   ???  Mometasone Furoate POWD, by Does not apply route., Disp: , Rfl:   ???  polyethylene glycol powder, One half to one capful mixed in 6-8 oz of water once daily.., Disp: 510 g, Rfl: 3  ???  progesterone (PROMETRIUM) 100 mg capsule, Take 100 mg by mouth daily., Disp: , Rfl:   ???  riboflavin 100 mg tablet, Take 100 mg by mouth daily., Disp: , Rfl:   ???  zolmitriptan (ZOMIG) 2.5 mg tablet, Take 2.5 mg by mouth every two (2) hours as needed for Migraine., Disp: , Rfl:   ???  pantoprazole 40 mg DR tablet, Take 40 mg by mouth daily., Disp: , Rfl:       Family History:  No family history on file.  No relevant family history of infectious diseases.    Social History:  Social History     Social History   ??? Marital status: Married     Spouse name: N/A   ??? Number of children: N/A   ??? Years of education: N/A     Social History Main Topics ??? Smoking status: Never Smoker   ??? Smokeless tobacco: Never Used   ??? Alcohol use Not on file   ??? Drug use: Unknown   ??? Sexual activity: Not on file     Other Topics Concern   ??? Not on file     Social History Narrative   ??? No narrative on file       Physical Exam:  BP 156/89  ~ Pulse 69  ~ Temp 37.3 ???C (99.1 ???F) (Oral)  ~ Resp 14  ~ Ht 1.575 m (5' 2'')  ~  Wt 65.3 kg (144 lb) Comment: Weight is per pt ~ SpO2 97%  ~ BMI 26.34 kg/m???       Vitals:    11/19/17 1543   Weight: 65.3 kg (144 lb)   Height: 1.575 m (5' 2'')     General: no acute distress  Eyes: anicteric sclerae, no injection, extraocular movements are intact  ENT: no oropharyngeal lesions, mmm, normal dentition  Neck: supple, no masses  Lymph: no cervical, supraclavicular, or axillary adenopathy   CV: Regular rate and rhythm. Normal S1 and S2. No murmurs, rubs, or gallops. 2+ distal pulses.   Pulm: Normal effort. Clear to auscultation bilaterally without rales, rhonchi, or wheezing.   GI: Soft, nondistended, and nontender. Normoactive bowel sounds. No guarding or rebound.  Musculoskeletal: No clubbing, cyanosis, or edema.   Skin: no visible rash, normal turgor  Neuro: Awake and alert, moving all 4 extremities. Grossly nonfocal exam.   Psych: Appropriate mood and affect. Normal judgment and insight.    Laboratory Data:   No recent labs  No results found for: WBC, HGB, HCT, MCV, PLT   No results found for: CREAT, BUN, NA, K, CL, CO2   No results found for: ALT, AST, GGT, ALKPHOS, BILITOT      Microbiology:   09/18/17 C diff PCR; negative    Imaging:   07/08/17 CT [facility])  IMPRESSION:    Significant focal wall thickening of the distal transverse colon  most likely secondary to acute diverticulitis. Underlying mass  cannot be completely excluded.    Status post appendectomy    Fatty infiltration of the liver    Assessment: 50 yoF w/ history of ascending aortic aneurysm s/p repair + bioprosthetic AVR, as well as recurrent diverticulitis; referred to ID for recommendations about antibiotic choice in event of further episodes of diverticulitis in setting of drug intolerances.    # Recurrent diverticulitis: currently no evidence of active diverticular disease; is at risk for recurrence  - Last colonoscopy was ~ 3 years prior    # Hx drug intolerance: No clearly documented hypersensitivity reactions  - Tendinopathy to FQ  - Migraines w/ augmentin  - Unspecified reaction to cephalexin  - ? Childhood sulfa allergy    # Migraines      Recommendations/Plan:     - Unfortunately oral treatment options are limited given intolerance to FQ and oral beta-lactams    - In the event of future episodes of diverticulitis would recommend the following antibiotic regimen:  -- Cefpodoxime (Vantin) 400 mg twice per day plus metronidazole (flagyl) 500 mg three times per day    -- If unable to tolerate this regimen then the next step would likely be to use IV therapy (ie ertapenem) as there are not other oral alternatives for treating diverticulitis (typically for oral treatment will use a fluoroquinolone such as levofloxacin or ciprofloxacin + flagyl or augmentin which she has been intolerant to)    --If necessary, pt has not had actual documented allergy to augmentin, therefore in the event of need for alternative this remains an option with aggressive anti-nausea/migraine prophylaxis    - If pt continues to have recurrent diverticulitis could consider discussion about sigmoid colectomy    Author:  Docia Chuck, MD 11/19/2017 3:22 PM

## 2017-11-20 NOTE — Patient Instructions
1) In the event of future episodes of diverticulitis would recommend the following antibiotic regimen:    - Cefpodoxime (Vantin) 400 mg twice per day plus metronidazole (flagyl) 500 mg three times per day    - If unable to tolerate this regimen then the next step would likely be to use IV therapy as there are not other oral alternatives for treating diverticulitis (typically for oral treatment will use a fluoroquinolone such as levofloxacin or ciprofloxacin + flagyl or augmentin which you have had problems with in the past)

## 2017-11-25 DIAGNOSIS — T3795XD Adverse effect of unspecified systemic anti-infective and antiparasitic, subsequent encounter: Secondary | ICD-10-CM

## 2017-11-25 DIAGNOSIS — Z8719 Personal history of other diseases of the digestive system: Secondary | ICD-10-CM

## 2017-11-25 DIAGNOSIS — K219 Gastro-esophageal reflux disease without esophagitis: Secondary | ICD-10-CM

## 2018-03-05 MED ORDER — DEXLANSOPRAZOLE 60 MG PO CPDR
60 mg | ORAL_CAPSULE | Freq: Every day | ORAL | 3 refills | Status: AC
Start: 2018-03-05 — End: 2018-06-23

## 2018-04-26 IMAGING — CT CT ABD-PELV W/ CM
2 of 5 series · 16 of 46 positions shown, 18 images · IV contrast (iopamidol)
Comparison: None

CLINICAL DATA: Lower abdominal pain, severe and cramping and not
resolving, history of diverticulosis

EXAM:
CT ABDOMEN AND PELVIS WITH CONTRAST
TECHNIQUE: Multidetector CT imaging of the abdomen and pelvis was performed
using the standard protocol following bolus administration of
intravenous contrast. Sagittal and coronal MPR images reconstructed
from axial data set.
CONTRAST:  100mL [id number] IOPAMIDOL ([id number]) INJECTION 61% IV.
Dilute oral contrast.

[Series 2: abd/pel with · axial · 0.79mm/px · z∈[+1230,+1595]mm · 13 of 87 slices shown, 15 images]
[im 7/87  soft-tissue]
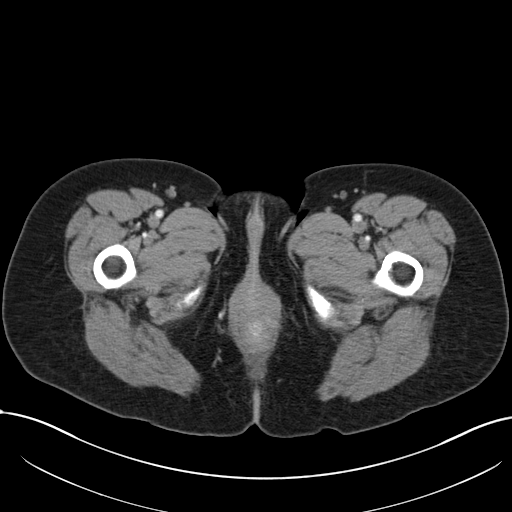
[im 7/87  bone]
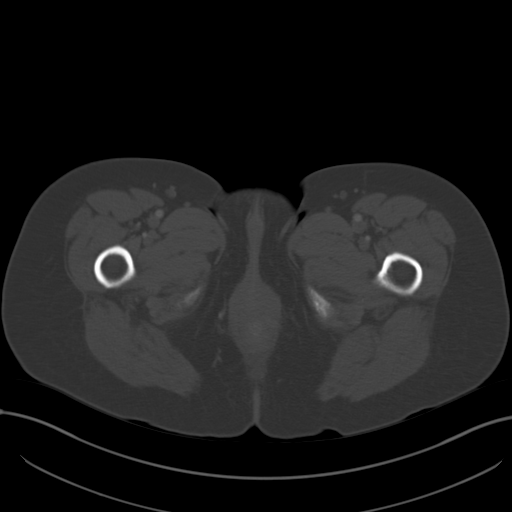
[im 13/87  soft-tissue]
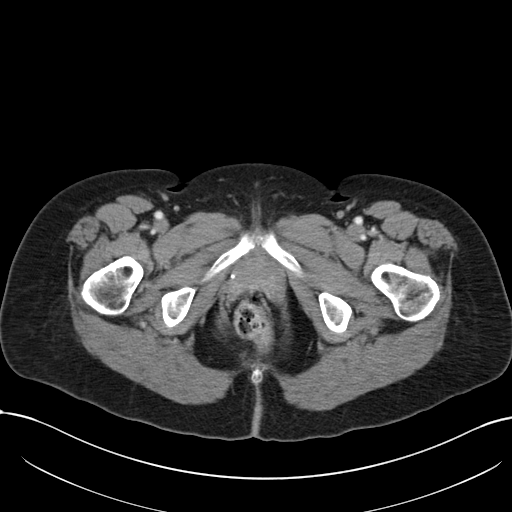
[im 19/87  soft-tissue]
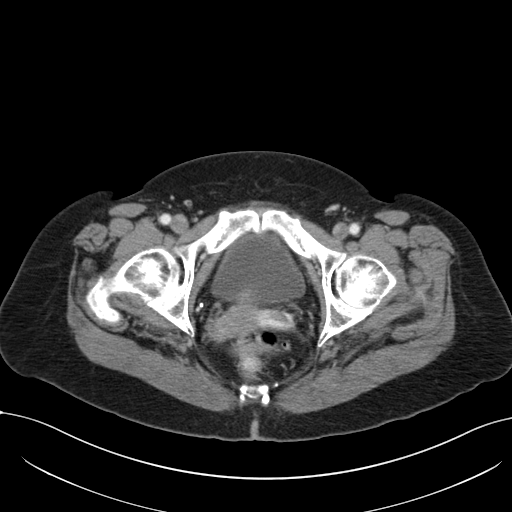
[im 25/87  soft-tissue]
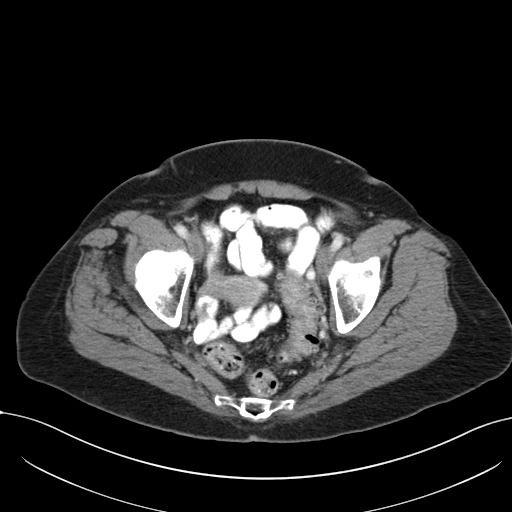
[im 31/87  soft-tissue]
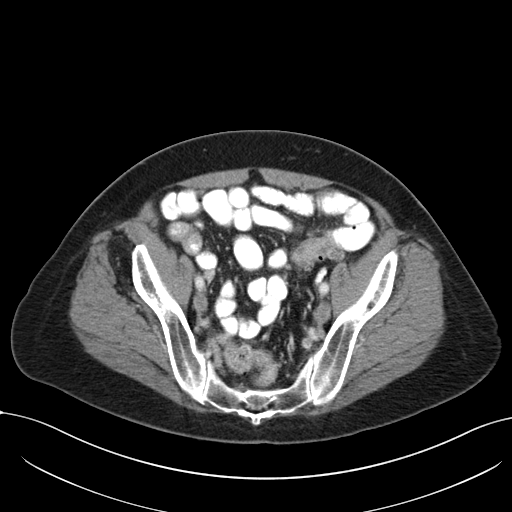
[im 37/87  soft-tissue]
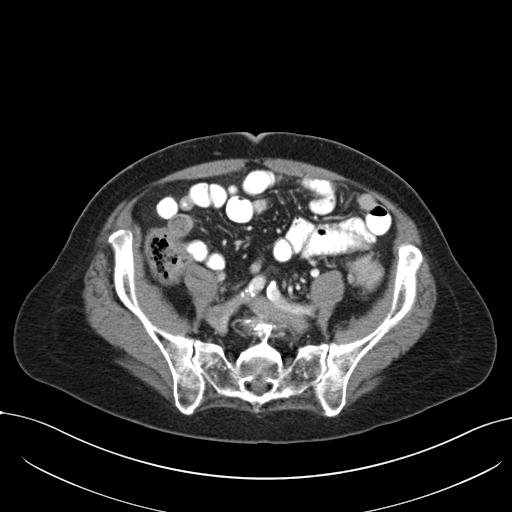
[im 44/87  soft-tissue]
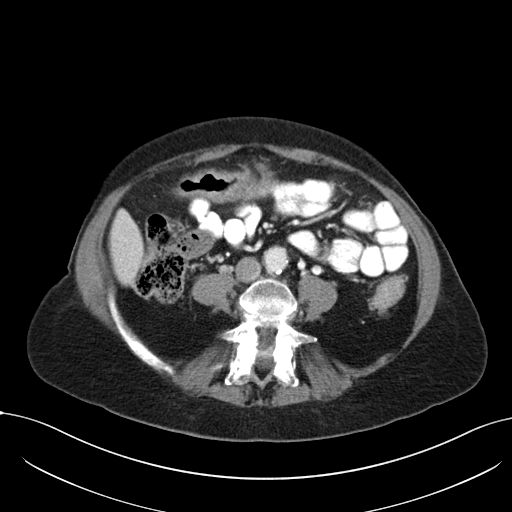
[im 50/87  soft-tissue]
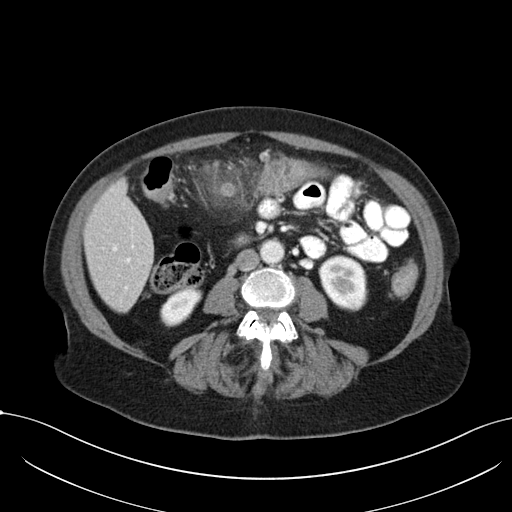
[im 56/87  soft-tissue]
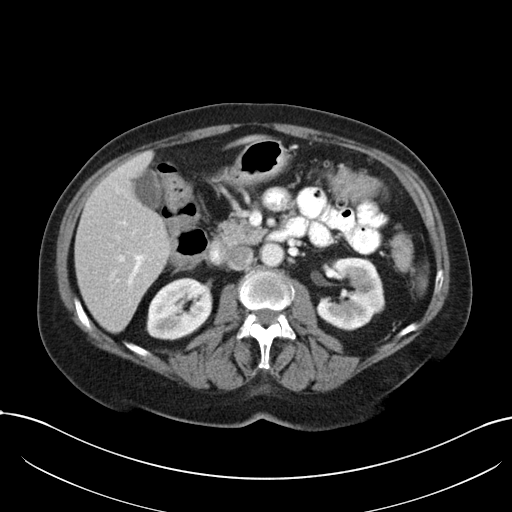
[im 56/87  bone]
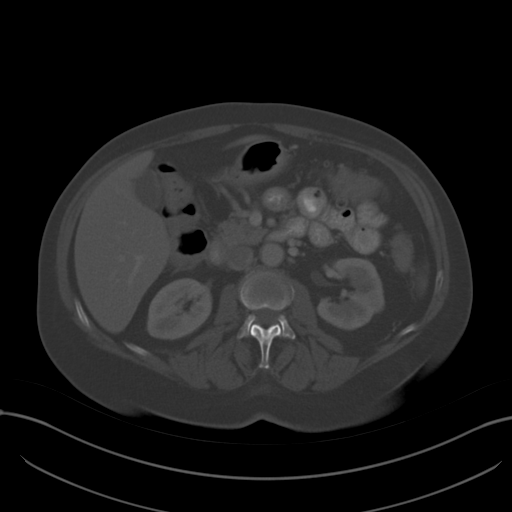
[im 62/87  soft-tissue]
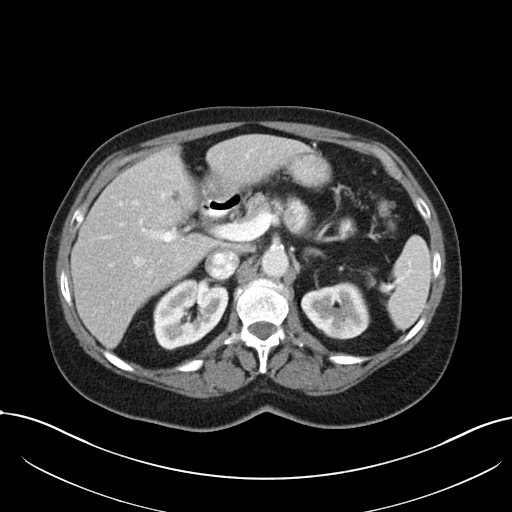
[im 68/87  soft-tissue]
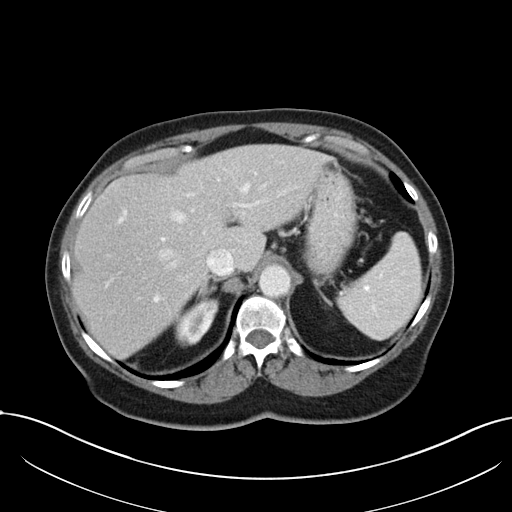
[im 74/87  soft-tissue]
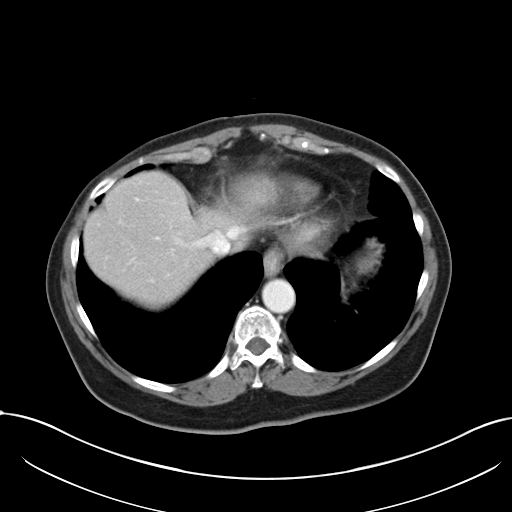
[im 80/87  soft-tissue]
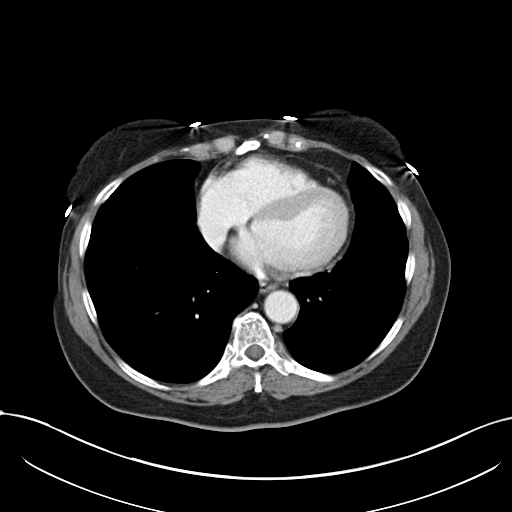

[Series 4: coronal a/|p · coronal · 0.87mm/px · 3 of 155 slices shown]
[im 52/155  soft-tissue]
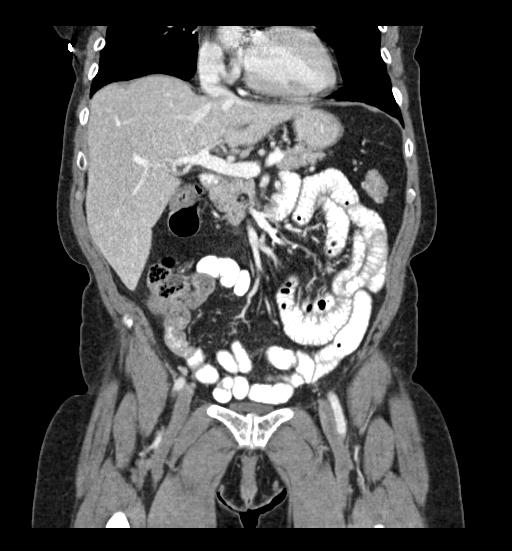
[im 69/155  soft-tissue]
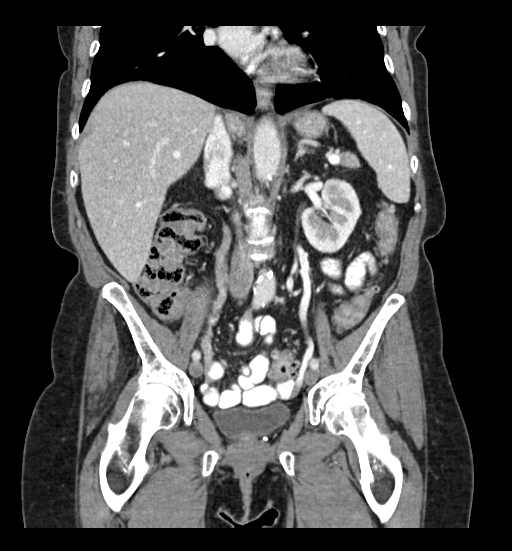
[im 86/155  soft-tissue]
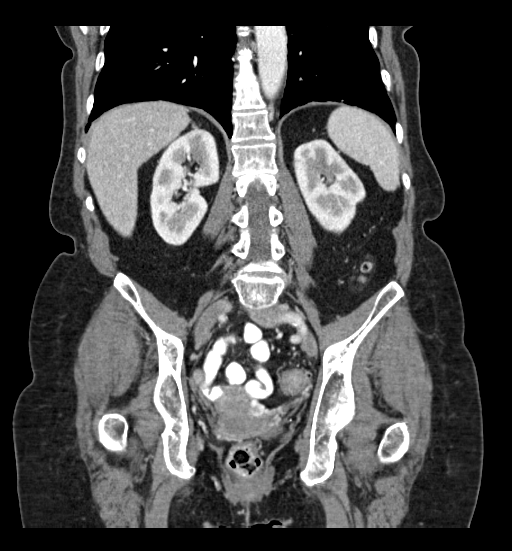

[16 of 46 positions shown; findings below may reference images not displayed]

FINDINGS: Lower chest: Lung bases clear

Hepatobiliary: Small hepatic cyst 9 mm diameter adjacent to
gallbladder fossa. Gallbladder and liver otherwise unremarkable.

Pancreas: Question small lipoma within the pancreatic head/ uncinate
10 x 8 x 7 mm in size. Remainder of pancreas normal appearance

Spleen: Normal appearance

Adrenals/Urinary Tract: Adrenal glands normal appearance. Probable
small LEFT renal cysts. Patchy areas within the nephrograms
bilaterally suspicious for pyelonephritis. No additional renal mass,
hydronephrosis, or hydroureter. Bladder unremarkable.

Stomach/Bowel: Appendix surgically absent. Wall thickening of the
proximal transverse colon with pericolic inflammatory changes and
note of multiple colonic diverticula compatible with acute
diverticulitis. No evidence of perforation or abscess. Colonic
diverticula are seen throughout remainder of the transverse,
descending, and sigmoid colon. Stomach and bowel loops otherwise
normal appearance.

Vascular/Lymphatic: Atherosclerotic calcifications aorta and iliac
arteries without aneurysm. No adenopathy. Post median sternotomy and
AVR.

Reproductive: Unremarkable uterus and adnexa

Other: No free air or free fluid.  No hernia.

Musculoskeletal: Osseous demineralization. Grade 3 anterolisthesis
at L4-L5 with apparent vertebral body fusion. Bones demineralized.
Degenerative facet disease changes lower lumbar spine.
IMPRESSION: Acute diverticulitis of the transverse colon with marked wall
thickening and pericolic inflammatory changes.

No evidence of perforation or abscess.

Patchy BILATERAL nephrograms suspicious for pyelonephritis;
recommend correlation with urinalysis.

Aortic atherosclerosis.

Grade 3 anterolisthesis L4-L5 with apparent vertebral body fusion.

Question small lipoma within the pancreatic head/uncinate 10 mm
greatest size.

## 2018-06-23 MED ORDER — DEXILANT 60 MG PO CPDR
ORAL_CAPSULE | 3 refills | Status: AC
Start: 2018-06-23 — End: 2018-10-21

## 2018-07-01 ENCOUNTER — Telehealth

## 2018-07-01 NOTE — Telephone Encounter
Call Back Request    MD: Dr. Gavin Potters       Reason for call back:  Patient would like a call back didn't want to disclose to much info said it was a delicate matter. Please assist     Any Symptoms:  []  Yes  [x]  No      ? If yes, what symptoms are you experiencing:    o Duration of symptoms (how long):    o Have you taken medication for symptoms (OTC or Rx):      Patient or caller has been notified of the 24-48 hour turnaround time.

## 2018-07-02 NOTE — Telephone Encounter
Patient wants to speak to you only

## 2018-07-15 NOTE — Telephone Encounter
Spoke to patient.  She has an issue with moving her bowels on the Aimovic.  Feels like she is plugged up.  She is getting bad hemorrhoids.  She gets urgency while walking and then needs to race to the restroom - usually hard pellets come out.  Can be incontinent.  MOM too strong.  Has not tried stool softeners consistently.    Miralax didn't really work.    She can try Miralax once daily.  17 g.    Cut back to the lower line if too strong.    Feels like her bowel movements are never complete.

## 2018-10-20 MED ORDER — DEXILANT 60 MG PO CPDR
ORAL_CAPSULE | 3 refills | Status: AC
Start: 2018-10-20 — End: 2019-02-27

## 2018-11-21 ENCOUNTER — Telehealth: Payer: BLUE CROSS/BLUE SHIELD

## 2018-11-21 NOTE — Telephone Encounter
Appointment Accommodation Request    MD Name: Dr. Gavin Potters    Appointment Type: Return    Reason for sooner request: Patient had severe abdominal pain this morning and went to urgent care. CAT scan was performed but was negative. Per patient & urgent care, patient has diverticulitis and was told to follow up with GI asap.    Date/Time Requested (If any): As soon as possible    Last seen by MD: 11/14/17    Any Symptoms:  [x]  Yes  []  No      ? If yes, what symptoms are you experiencing:  Abdominal pain  o Duration of symptoms (how long): Since 4am today    Patient was offered an appointment but declined.    Patient was advised to seek emergency services if conditions are urgent or emergent.    Patient has been notified of the 24-48 hour turnaround time.

## 2018-11-24 NOTE — Telephone Encounter
Offered pt appt 1/16, pt declined (husband has an important appt that day)    Dr Gavin Potters- can you accommodate?

## 2018-11-25 ENCOUNTER — Telehealth: Payer: MEDICARE

## 2018-11-25 NOTE — Telephone Encounter
Called patient and left VM to schedule appt for 1/15 at 9am with Dr.Getzug. If patient calls back please transfer

## 2018-11-25 NOTE — Progress Notes
PATIENTKiandria Crane  MRN: [id number]  DOB: [birth year]  DATE OF SERVICE: 11/25/2018    REFERRING PRACTITIONER: Koren Bound., MD  PRIMARY CARE PROVIDER: Linzie Collin, MD    GI FOLLOW UP    Subjective:     Chief Complaint:  Charlene Crane is a 73 y.o. female with history of recurrent diverticulitis and Breast CA who presents for followup.    Recurrent diverticulitis - first time in [id number] then again two weeks ago.  She has been eating high fiber foods and fiber supplements.    03/2017 - sudden onset LLQ pain which persisted.  No fever.  Seen in [place] in [place].  Had a CT - started on Augmentin - severe migraine - very sick from the antibiotic.  Given pain medication - tramadol with Zofran.  Took ABx for 10 days.  Had a follow up MRI after course.  Found a UTI and a kidney infection.  No symptoms.     06/2017 - recurrent LLQ pain.  Seen in [place] - treated with Augmentin.  Resolution of pain.    This Saturday night - same LLQ pain - fine on Sunday.  Had UTI - on Macrobid.      10 /2018 - Problems with diarrhea - for a couple of years.  Gets cramps, urgency then runs to the restroom - loose or formed then loose - can think finished but then gets another urge to move her bowels.  Hard to clean.  Incomplete evacuation.  Usually in the morning.  Can occur in the afternoon.  Hard to get out of the house.  Eats - trigger.  No blood in her stool except from wiping.  Started on chinese herbs - helped by bulking her stools and made less frequent.  Taking metamucil 1 tsp - stopped with recent diverticulitis.      Gets heartburn.   Random.  Taking protonix once daily in the morning.  Takes pepcid as needed.  Seen by Dr. Lisabeth Devoid in the past.      Last colonoscopy 2-3 years ago - had benign polyps and diverticulosis.      Sensitive to antibiotics and pain medications.  Needs Zofran with the pain medications and antibiotics. Gets migraines from antibiotics.  Gets nausea from pain medications. Got tendon problems from Cipro.  Macrobid isn't upsetting her stomach.      Feels low energy.  Taking a lot of supplements from her headache doctor, urologist and Dr. Phineas Real.  Taking Vitamin B12.    Seeing a dietitian.  Counseled on a high fiber diet.  Took a probiotic after the Augmentin.   No air or stool in her urine.  Followed by urology.  Urine clears after treatment.     REC  1.  Increase your metamucil to 1 TBSP once or twice daily.  Build up from 1 tsp every few days up to 1 TBSP.    2.  OK to continue your herbs and dietary recommendations as outlined.  3.  Florastor - 1-2 times daily   4.  Get Dr. Amylah Held information on herbs  5.  Consider infectious disease consultation regarding antibiotic use in the future.  6.  Consider colonoscopy sooner than next planned timing.  7.  Check serum ionized calcium, celiac testing.  8.  Stool testing for C.difficile, calprotectin and O/P    11/2017 - stopped the herbs.  Started an injection monthly to prevent migraines.  Amovig.  Constipation can be a side effect.  Really helped her  headaches.  Stools became hard and hard to pass.  Had abdominal pain yesterday - had a small hard stool for a few days.  Had pain all day yesterday and this morning despite having two larger softer stools.  The pain is across her lower abdomen.  (Her diverticulitis pain usually migrates to the left side).  Very gassy, having more flatulence and indigestion/belching.  Nauseated after eating for the last two weeks.  Close to the end of the dosage course.  Belly feels hard and bloated.  Feels better with walking.  Taking metamucil.  No blood in her stool.    Still taking pantoprazole 40 mg before breakfast.  Getting heartburn after eating.  Takes pepcid with indigestion.  Had taken two in the past.    No fever.  No appetite today.    Trying to lose weight.  Hard to lose.    Nausea sudden onset.    Taking 200 mg daily.    Stopped depakote - hair thinning. Started [employer].    REC  1.  Stop your stool softener.  2.  Start Miralax - one half to one capful mixed in 6-8 oz of water once daily to help regulate your bowel habits.    3.  Dexlansoprazole - 60 mg - take one daily as an alternative to pantoprazole.  If you don't buy this, increase pantoprazole to twice daily 30 min before breakfast and dinner.    4.  Let Dr. Shiryl Held know how you are feeling in two weeks or sooner if the pain worsens.    5.  Infectious disease consultation - regarding antibiotic choices for diverticulitis.      Interval history - seen by ID - recs made for antibiotics. Constipation with Aimovic.            Past Medical History:    Migraines  Diverticulosis.    Aortic aneurysm  Breast CA -2014  Colon polyps  Duodenal ulcer - more than 20 years ago.   Sleep apnea     Past Surgical History:    AVR - bovine, aortic aneurysm repair  Meningioma   Appendectomy  Breast CA surgery    Family History:  Mother - colon polyps, Breast CA, ulcers.  No colon cancer.      Social History:  reports that she has never smoked. She has never used smokeless tobacco.;  Retired Programmer, systems.  Married.  Husband, Avi.      Allergies:   is allergic to ciprofloxacin; dexamethasone; gabapentin; hydrocodone-acetaminophen; hydromorphone; levofloxacin; oxycodone; penicillins; sulfa antibiotics; tramadol; and cephalexin.    Medications:  Current Outpatient Medications   Medication Sig   ??? amlodipine-atorvastatin 2.5-10 MG per tablet Take 1 tablet by mouth daily.   ??? anastrozole 1 mg tablet Take 1 mg by mouth daily.   ??? aspirin 81 mg EC tablet Take 81 mg by mouth daily.   ??? atorvastatin 20 mg tablet Take 20 mg by mouth at bedtime.   ??? Boswellia Serrata (BOSWELLIA PO) Take by mouth.   ??? Calcium-Magnesium-Zinc 167-83-8 MG TABS Take by mouth.   ??? Cholecalciferol (VITAMIN D3) 3000 units TABS Take 5,000 Units by mouth.   ??? Coenzyme Q10 (CO Q-10) 300 MG CAPS Take by mouth.   ??? Cyanocobalamin (VITAMIN B 12 PO) Take by mouth. ??? D-MANNOSE PO Take by mouth.   ??? DEXILANT 60 MG DR capsule TAKE 1 CAPSULE BY MOUTH EVERY DAY   ??? divalproex 250 mg DR tablet Take 250 mg by mouth two (2) times  daily.   ??? docusate 100 mg capsule Take 100 mg by mouth two (2) times daily.   ??? Efinaconazole (JUBLIA) 10 % SOLN external solution Apply topically.   ??? Misc Natural Products (ESTROVEN ENERGY PO) Take by mouth.   ??? Mometasone Furoate POWD by Does not apply route.   ??? pantoprazole 40 mg DR tablet Take 40 mg by mouth daily.   ??? polyethylene glycol powder One half to one capful mixed in 6-8 oz of water once daily.Marland Kitchen   ??? progesterone (PROMETRIUM) 100 mg capsule Take 100 mg by mouth daily.   ??? riboflavin 100 mg tablet Take 100 mg by mouth daily.   ??? zolmitriptan (ZOMIG) 2.5 mg tablet Take 2.5 mg by mouth every two (2) hours as needed for Migraine.     No current facility-administered medications for this visit.      Review of Systems:  A complete ROS was performed. Pertinent positives and negatives are in the HPI.  Remainder of 14 systems is negative.    Objective:     Physical Exam:    Vitals: There were no vitals taken for this visit.  Weight stable   Constitutional:WD/WN alert, appears stated age and cooperative   Head: Normocephalic, without obvious abnormality, atraumatic   Eyes: conjunctivae/corneas clear. PERRL, EOM's intact, anicteric.    Mouth: No perioral lesions.  Tongue is normal.  Mucous membranes moist.  No oral or posterior pharyngeal lesions   Neck: no adenopathy, supple, symmetrical, trachea midline and thyroid not enlarged, symmetric, no tenderness/mass/nodules   Back: symmetric, no curvature. ROM normal. No CVA tenderness.   Lungs: clear to auscultation bilaterally   Heart: regular rate and rhythm, S1, S2 normal, no murmur, click, rub or gallop   Abdomen: Abdomen is soft and flat, nontender with active bowel sounds. No rebound or guarding. No appreciable flank or shifting dullness. No appreciable hepatosplenomegaly. No umbilical hernia. Rectal: deferred     Extremities: extremities normal, atraumatic, no cyanosis or edema   Skin: Skin color, texture, turgor normal. No rashes or lesions.  Scar on head. Pulses: 2+ and symmetric   Lymph Nodes: Cervical, supraclavicular, and axillary nodes normal.   Neurologic: awake, alert and oriented.     Lab Review:  Component      Latest Ref Rng & Units 09/17/2017 09/18/2017   Tissue Transglutaminase Ab, IGG ([facility])      U/mL 4    Tissue Transglutaminase Ab, IGA ([facility])      U/mL <1    Clostridium Difficile Toxin/GDH W/Refl To PCR ([facility])        SEE NOTE   Immunoglobulin A ([facility])      81 - 463 mg/dL 782    Calcium, Ionized ([facility])      4.8 - 5.6 mg/dL 5.4    Calprotectin, Stool ([facility])      mcg/g  16.0   Ova And Parasites, Stool Conc And Perm Smear ([facility])        SEE NOTE     O/P and C.diff - negative    07/2017 - normal CBC, plt, BMP, hepatic panel,  Calcium - 11.2  06/2017 - lipase - 18,     Imaging:  06/2017 - CT abdomen/pelvis  Significant focal wall thickening of the distal transverse colon  most likely secondary to acute diverticulitis. Underlying mass  cannot be completely excluded.  Status post appendectomy  Fatty infiltration of the liver    04/2017 - MRI abdomen  1. Previously described findings of acute bilateral pyelonephritis  have resolved .  2. Previously described findings of acute diverticulitis in the  transverse colon have resolved .  3. Subcentimeter unilocular cystic pancreatic body lesion without  high risk MRI features. Follow-up MRI abdomen without and with IV  contrast is recommended review 2 years until 10 year stability has  been demonstrated.    03/2017 - CT abdomen/pelvis  Acute diverticulitis of the transverse colon with marked wall  thickening and pericolic inflammatory changes.  No evidence of perforation or abscess.  Patchy BILATERAL nephrograms suspicious for pyelonephritis;  recommend correlation with urinalysis.  Aortic atherosclerosis. Grade 3 anterolisthesis L4-L5 with apparent vertebral body fusion.  Question small lipoma within the pancreatic head/uncinate 10 mm  greatest size.    GI Studies:  11/2015 - Colonoscopy - severe angulation and looping of instrument in left colon, moderately severe diverticular disease most prominent in the left colon.  Sub-optimal prep - cleared with lavage.  Two polyps 6 mm and 2 mm, severe diverticular disease - throughout the colon, most prominent in left colon      Assessment :     1.  Diverticular disease with two episodes of diverticulitis in her transverse colon in the last six months.  No evidence on exam for acute diverticulitis.  Intolerance to multiple antibiotics.  Need plan going forward for regimen to use in case of acute diverticulitis.  Will refer to ID.    2.  Diarrhea and cramping - blood on wiping - rule out inflammatory, infectious (less likely), SCAD (diverticular colitis), obstructive.  Resolved.  Negative celiac and stool testing.  3.  Lower abdominal pain for two days - associated with constipation - will try to regulate bowel habits.  Constipation may be side effect of new migraine drug.    4.  History of hemorrhoids  5.  GERD - ongoing symptoms despite pantoprazole.  6.  High serum calcium - normal ionized calcium.  7.  Sensitivity to medications:  Migraines from antibiotics, nausea from pain medications.  8.  History of breast CA    Plan:             Greater than 50% of this 25 minute appointment was spent in face to face counseling, coordination of care and review of medical records.        Author: Koren Bound, MD 11/25/2018 11:53 AM

## 2018-11-25 NOTE — Telephone Encounter
I can see her Wed AM at 0900 - do not overbook - put her at 0930 if someone already in that slot.

## 2018-11-25 NOTE — Telephone Encounter
Scheduled pt 1/15 9am,  Left detailed message for pt

## 2018-11-26 ENCOUNTER — Ambulatory Visit: Payer: MEDICARE | Attending: Gastroenterology

## 2018-11-27 ENCOUNTER — Ambulatory Visit: Payer: BLUE CROSS/BLUE SHIELD | Attending: Gastroenterology

## 2018-12-16 ENCOUNTER — Ambulatory Visit: Payer: MEDICARE | Attending: Gastroenterology

## 2018-12-16 DIAGNOSIS — K5900 Constipation, unspecified: Secondary | ICD-10-CM

## 2018-12-16 NOTE — Patient Instructions
1.  Kegel exercises - 20-30 squeezes 2-3 times daily.  2.  Continue Metamucil 1 TBSP twice daily.  3.  Miralax - 1/2 capful mixed in water with the metamucil at night.    4.  Let Dr. Saanvi Held know how much magnesium you take daily.

## 2018-12-16 NOTE — Progress Notes
PATIENTLatesia Crane  MRN: [id number]  DOB: 08-14-1946  DATE OF SERVICE: 12/16/2018    REFERRING PRACTITIONER: No ref. provider found  PRIMARY CARE PROVIDER: Linzie Collin, MD    GI FOLLOW UP    Subjective:     Chief Complaint:  Charlene Crane is a 73 y.o. female with history of recurrent diverticulitis and Breast CA who presents for followup.    Recurrent diverticulitis - first time in May 2018 then again two weeks ago.  She has been eating high fiber foods and fiber supplements.    03/2017 - sudden onset LLQ pain which persisted.  No fever.  Seen in [place] in [place].  Had a CT - started on Augmentin - severe migraine - very sick from the antibiotic.  Given pain medication - tramadol with Zofran.  Took ABx for 10 days.  Had a follow up MRI after course.  Found a UTI and a kidney infection.  No symptoms.     06/2017 - recurrent LLQ pain.  Seen in [place] - treated with Augmentin.  Resolution of pain.    This Saturday night - same LLQ pain - fine on Sunday.  Had UTI - on Macrobid.      10 /2018 - Problems with diarrhea - for a couple of years.  Gets cramps, urgency then runs to the restroom - loose or formed then loose - can think finished but then gets another urge to move her bowels.  Hard to clean.  Incomplete evacuation.  Usually in the morning.  Can occur in the afternoon.  Hard to get out of the house.  Eats - trigger.  No blood in her stool except from wiping.  Started on chinese herbs - helped by bulking her stools and made less frequent.  Taking metamucil 1 tsp - stopped with recent diverticulitis.      Gets heartburn.   Random.  Taking protonix once daily in the morning.  Takes pepcid as needed.  Seen by Dr. Lisabeth Devoid in the past.      Last colonoscopy 2-3 years ago - had benign polyps and diverticulosis.      Sensitive to antibiotics and pain medications.  Needs Zofran with the pain medications and antibiotics. Gets migraines from antibiotics.  Gets nausea from pain medications. Got tendon problems from Cipro.  Macrobid isn't upsetting her stomach.      Feels low energy.  Taking a lot of supplements from her headache doctor, urologist and Dr. Phineas Real.  Taking Vitamin B12.    Seeing a dietitian.  Counseled on a high fiber diet.  Took a probiotic after the Augmentin.   No air or stool in her urine.  Followed by urology.  Urine clears after treatment.     REC  1.  Increase your metamucil to 1 TBSP once or twice daily.  Build up from 1 tsp every few days up to 1 TBSP.    2.  OK to continue your herbs and dietary recommendations as outlined.  3.  Florastor - 1-2 times daily   4.  Get Dr. Aniston Held information on herbs  5.  Consider infectious disease consultation regarding antibiotic use in the future.  6.  Consider colonoscopy sooner than next planned timing.  7.  Check serum ionized calcium, celiac testing.  8.  Stool testing for C.difficile, calprotectin and O/P    11/2017 - stopped the herbs.  Started an injection monthly to prevent migraines.  Amovig.  Constipation can be a side effect.  Really helped  her headaches.  Stools became hard and hard to pass.  Had abdominal pain yesterday - had a small hard stool for a few days.  Had pain all day yesterday and this morning despite having two larger softer stools.  The pain is across her lower abdomen.  (Her diverticulitis pain usually migrates to the left side).  Very gassy, having more flatulence and indigestion/belching.  Nauseated after eating for the last two weeks.  Close to the end of the dosage course.  Belly feels hard and bloated.  Feels better with walking.  Taking metamucil.  No blood in her stool.    Still taking pantoprazole 40 mg before breakfast.  Getting heartburn after eating.  Takes pepcid with indigestion.  Had taken two in the past.    No fever.  No appetite today.    Trying to lose weight.  Hard to lose.    Nausea sudden onset.    Taking 200 mg daily.    Stopped depakote - hair thinning. Started [employer].    REC  1.  Stop your stool softener.  2.  Start Miralax - one half to one capful mixed in 6-8 oz of water once daily to help regulate your bowel habits.    3.  Dexlansoprazole - 60 mg - take one daily as an alternative to pantoprazole.  If you don't buy this, increase pantoprazole to twice daily 30 min before breakfast and dinner.    4.  Let Dr. Banita Held know how you are feeling in two weeks or sooner if the pain worsens.    5.  Infectious disease consultation - regarding antibiotic choices for diverticulitis.      Interval history - seen by ID - recs made for antibiotics if she gets diverticulitis again. Constipation with Amovig.  Off Amovig - now on Ajovy for two months.    Still constipated.  Miralax gave her diarrhea - multiple episodes of diarrhea - still with incomplete evacuation.  Took in the evening.  Tried taking metamucil 1 TBSP twice daily. Stools are small rocks. Stools are never complete or finished.  At times needs to urgently have a second stool.  Needs to be close to the bathroom.  Impacting her life.  Rare incontinence.  Had left sided abdominal pain 1/10 with a negative CT.    Hard to evacuate despite.  No prolapse.  Hemorrhoids.    Off protonix.  Taking Dexilant at night.    Taking Ca/Mg/Zn supplement    Past Medical History:    Migraines  Diverticulosis.    Aortic aneurysm  Breast CA -2014  Colon polyps  Duodenal ulcer - more than 20 years ago.   Sleep apnea     Past Surgical History:    AVR - bovine, aortic aneurysm repair  Meningioma   Appendectomy  Breast CA surgery    Family History:  Mother - colon polyps, Breast CA, ulcers.  No colon cancer.      Social History:  reports that she has never smoked. She has never used smokeless tobacco. She reports current alcohol use of about 0.6 oz of alcohol per week. She reports that she does not use drugs.;  Retired Programmer, systems.  Married.  Husband, Avi.      Allergies:   is allergic to ciprofloxacin; dexamethasone; gabapentin; hydrocodone-acetaminophen; hydromorphone; levofloxacin; oxycodone; penicillins; sulfa antibiotics; tramadol; and cephalexin.    Medications:  Current Outpatient Medications   Medication Sig   ??? AJOVY 225 MG/1.5ML SOSY    ??? amlodipine-atorvastatin 2.5-10 MG  per tablet Take 1 tablet by mouth daily.   ??? aspirin 81 mg EC tablet Take 81 mg by mouth daily.   ??? atorvastatin 20 mg tablet Take 20 mg by mouth at bedtime.   ??? Boswellia Serrata (BOSWELLIA PO) Take by mouth.   ??? Calcium-Magnesium-Zinc 167-83-8 MG TABS Take by mouth.   ??? Cholecalciferol (VITAMIN D3) 3000 units TABS Take 5,000 Units by mouth.   ??? Coenzyme Q10 (CO Q-10) 300 MG CAPS Take by mouth.   ??? Cyanocobalamin (VITAMIN B 12 PO) Take by mouth.   ??? D-MANNOSE PO Take by mouth.   ??? DEXILANT 60 MG DR capsule TAKE 1 CAPSULE BY MOUTH EVERY DAY   ??? Misc Natural Products (ESTROVEN ENERGY PO) Take by mouth.   ??? progesterone (PROMETRIUM) 100 mg capsule Take 100 mg by mouth daily.   ??? riboflavin 100 mg tablet Take 100 mg by mouth daily.   ??? SYMBICORT 160-4.5 MCG/ACT inhaler    ??? zolmitriptan (ZOMIG) 2.5 mg tablet Take 2.5 mg by mouth every two (2) hours as needed for Migraine.   ??? anastrozole 1 mg tablet Take 1 mg by mouth daily.   ??? divalproex 250 mg DR tablet Take 250 mg by mouth two (2) times daily.   ??? docusate 100 mg capsule Take 100 mg by mouth two (2) times daily.   ??? Efinaconazole (JUBLIA) 10 % SOLN external solution Apply topically.   ??? Mometasone Furoate POWD by Does not apply route.   ??? pantoprazole 40 mg DR tablet Take 40 mg by mouth daily.   ??? polyethylene glycol powder One half to one capful mixed in 6-8 oz of water once daily.. (Patient not taking: Reported on 12/16/2018.)     No current facility-administered medications for this visit.      Review of Systems:  A complete ROS was performed. Pertinent positives and negatives are in the HPI.  Remainder of 14 systems is negative.    Objective:     Physical Exam: Vitals: BP 123/80  ~ Pulse 73  ~ Temp 37 ???C (98.6 ???F) (Oral)  ~ Resp 18  ~ Ht 5' 2'' (1.575 m)  ~ Wt 132 lb (59.9 kg) Comment: verbal by pt ~ SpO2 94%  ~ BMI 24.14 kg/m???   Weight stable   Constitutional:WD/WN alert, appears stated age and cooperative   Head: Normocephalic, without obvious abnormality, atraumatic   Eyes: conjunctivae/corneas clear. PERRL, EOM's intact, anicteric.    Mouth: No perioral lesions.  Tongue is normal.  Mucous membranes moist.  No oral or posterior pharyngeal lesions   Neck: no adenopathy, supple, symmetrical, trachea midline and thyroid not enlarged, symmetric, no tenderness/mass/nodules   Back: symmetric, no curvature. ROM normal. No CVA tenderness.   Lungs: clear to auscultation bilaterally   Heart: regular rate and rhythm, S1, S2 normal, no murmur, click, rub or gallop   Abdomen: Abdomen is soft and flat, nontender with active bowel sounds. No rebound or guarding. No appreciable flank or shifting dullness. No appreciable hepatosplenomegaly. No umbilical hernia.  No masses.   Rectal: Decreased anal tone.  Decreased squeeze.  Normal PR relaxation with bearing down.  Reduced anal relaxation with valsalva.      Extremities: extremities normal, atraumatic, no cyanosis or edema   Skin: Skin color, texture, turgor normal. No rashes or lesions.  Scar on head. Pulses: 2+ and symmetric   Lymph Nodes: Cervical, supraclavicular, and axillary nodes normal.   Neurologic: awake, alert and oriented.     Lab Review:  Component      Latest Ref Rng & Units 09/17/2017 09/18/2017   Tissue Transglutaminase Ab, IGG ([facility])      U/mL 4    Tissue Transglutaminase Ab, IGA ([facility])      U/mL <1    Clostridium Difficile Toxin/GDH W/Refl To PCR ([facility])        SEE NOTE   Immunoglobulin A ([facility])      81 - 463 mg/dL 578    Calcium, Ionized ([facility])      4.8 - 5.6 mg/dL 5.4    Calprotectin, Stool ([facility])      mcg/g  16.0   Ova And Parasites, Stool Conc And Perm Smear ([facility])        SEE NOTE     O/P and C.diff - negative 07/2017 - normal CBC, plt, BMP, hepatic panel,  Calcium - 11.2  06/2017 - lipase - 18,     Imaging:  06/2017 - CT abdomen/pelvis  Significant focal wall thickening of the distal transverse colon  most likely secondary to acute diverticulitis. Underlying mass  cannot be completely excluded.  Status post appendectomy  Fatty infiltration of the liver    04/2017 - MRI abdomen  1. Previously described findings of acute bilateral pyelonephritis  have resolved .  2. Previously described findings of acute diverticulitis in the  transverse colon have resolved .  3. Subcentimeter unilocular cystic pancreatic body lesion without  high risk MRI features. Follow-up MRI abdomen without and with IV  contrast is recommended review 2 years until 10 year stability has  been demonstrated.    03/2017 - CT abdomen/pelvis  Acute diverticulitis of the transverse colon with marked wall  thickening and pericolic inflammatory changes.  No evidence of perforation or abscess.  Patchy BILATERAL nephrograms suspicious for pyelonephritis;  recommend correlation with urinalysis.  Aortic atherosclerosis.  Grade 3 anterolisthesis L4-L5 with apparent vertebral body fusion.  Question small lipoma within the pancreatic head/uncinate 10 mm  greatest size.    GI Studies:  11/2015 - Colonoscopy - severe angulation and looping of instrument in left colon, moderately severe diverticular disease most prominent in the left colon.  Sub-optimal prep - cleared with lavage.  Two polyps 6 mm and 2 mm, severe diverticular disease - throughout the colon, most prominent in left colon      Assessment :     1.  Diverticular disease with two episodes of diverticulitis in her transverse colon in the last six months.  No evidence on exam for acute diverticulitis.  Intolerance to multiple antibiotics.  Need plan going forward for regimen to use in case of acute diverticulitis.  ID recommends Vantin?Flagyl with next recurrence. 2.  Diarrhea and cramping - blood on wiping - rule out inflammatory, infectious (less likely), SCAD (diverticular colitis), obstructive.  Resolved.  Negative celiac and stool testing.  Now with constipation with sense of incomplete evacuation.  3.  Lower abdominal pain - associated with constipation - now improved.    4.  History of hemorrhoids  5.  GERD - ongoing symptoms despite pantoprazole.  6.  High serum calcium - normal ionized calcium.  7.  Sensitivity to medications:  Migraines from antibiotics, nausea from pain medications.  8.  History of breast CA    Plan:     1.  Kegel exercises - 20-30 squeezes 2-3 times daily.  2.  Continue Metamucil 1 TBSP twice daily.  3.  Miralax - 1/2 capful mixed in water with the metamucil at night.    4.  Let Dr. Nemesis Held know how much magnesium you take daily.      Greater than 50% of this 25 minute appointment was spent in face to face counseling, coordination of care and review of medical records.        Author: Koren Bound, MD 12/16/2018 3:22 PM

## 2019-02-26 MED ORDER — DEXLANSOPRAZOLE 60 MG PO CPDR
ORAL_CAPSULE | 5 refills | Status: AC
Start: 2019-02-26 — End: ?

## 2019-03-17 ENCOUNTER — Telehealth: Payer: MEDICARE

## 2019-03-17 NOTE — Telephone Encounter
Call Back Request    MD:  Gavin Potters    Reason for call back: medical flare up.    Any Symptoms:  [x]  Yes  []  No      ? If yes, what symptoms are you experiencing:  Upper abd pain after eating  o Duration of symptoms (how long):  1 day  o Have you taken medication for symptoms (OTC or Rx):  Dexilant/pepcid ac    Patient or caller has been notified of the 24-48 hour turnaround time. pls fu asap.

## 2019-03-20 NOTE — Telephone Encounter
Spoke to patient.    Had strong upper abdominal pains - under her breasts across the middle of her abdomen.    Felt like heartburn.  No SOB, nausea.    Took Dexilant and famotidine.  The next morning felt better.  Pain came back after she ate.  The whole next day had pain.  Pain didn't go to her back.  Burning type pain.    Woke up yesterday morning and was better then went away.    Valve replacement  No history of gallstones    REC  Cardiology eval  TUMS  Further evaluation pending symptoms

## 2019-08-17 MED ORDER — DEXLANSOPRAZOLE 60 MG PO CPDR
ORAL_CAPSULE | 5 refills | Status: AC
Start: 2019-08-17 — End: ?

## 2020-02-28 MED ORDER — DEXILANT 60 MG PO CPDR
ORAL_CAPSULE | 5 refills
Start: 2020-02-28 — End: ?

## 2020-02-29 MED ORDER — DEXLANSOPRAZOLE 60 MG PO CPDR
ORAL_CAPSULE | 5 refills | Status: AC
Start: 2020-02-29 — End: ?

## 2020-06-20 ENCOUNTER — Ambulatory Visit: Payer: MEDICARE

## 2020-06-20 DIAGNOSIS — C44529 Squamous cell carcinoma of skin of other part of trunk: Secondary | ICD-10-CM

## 2020-06-20 NOTE — Patient Instructions
Patient given verbal and paper wound care instructions. Patient expressed understanding. Wound Care instructions provided by [facility].     Post-Operative Wound Care Instructions  [facility]  [facility]  [address] [place] [address]  [place] [address]  Tel: [phone]    1. For the first 24 hours after surgery, do not get the wound wet and keep the dressing as it is, do not remove.  2. After 24 hours, you may get your wound wet (i.e. take a shower, etc.). At this point you must start changing your wound dressing 2 times a day:   a. Carefully remove the old dressing.  b. Cleanse wound with a mild soap and water.   c. Dry wound with gauze and apply Vaseline ointment or Aquaphor  using cotton swabs.  d. Cover with Telfa pads  (non-stick Dressing or non-adherent Dressing)  cut to size. If there is any oozing/draining you may also add some gauze cut to size.  e. Then secure the dressing in place with paper tape.    3. Take Tylenol (acetominophen) for any discomfort. Avoid Aspirin (acetylsalicylic acid) or any product containing Aspirin (Anacin, etc.). Avoid alcohol for 5 days post-operatively.  4. If any bleeding should occur, apply 20 minutes of constant pressure. If the situation persists, please call us immediately.   5. For several days after surgery, local swelling and drainage of clear or blood-tinged fluid from the wound may occur. If there is persisting noticeable redness, swelling, pain and/or pus after 3 to 4 days, you may have an infection. Please don???t hesitate to call us.  6. Avoid any strenuous exercise or activity (i.e. bending, lifting heavy objects) that could be harmful to the wound for at least three weeks.  7. If surgery was performed around the lips or cheeks, minimize activity for 2-3 weeks. (i.e. excessive laughing, smiling, eating any hard foods (steak, apples, etc.) should be avoided)  8. If surgery was performed on lower extremities, the leg needs to be elevated as much as possible. Activity and weight bearing the feet should be minimized.  9. Avoid lying or sleeping on the side where surgery was performed.    10. When shaving, leave your bandage on and shave around it.  If you shave over the sutures, you will cut them and risk the wound opening and/or infection     Suture removal: Dissolvable stitches are in, wound care daily for 14 days or until stitches dissolve.  Wound care daily for 14 days or until stitches dissolve.      Office: [phone]    Nights and weekends, Contact the Mohs  Micrographic Surgery and Dermatologic Oncology Fellow  Wylene Simmer, M.D.  Pager [phone]  [facility] pager [phone]

## 2020-06-20 NOTE — Progress Notes
Dermatologic Procedure (Mohs Surgery)     Pre-op Diagnosis:  Squamous cell carcinoma      Anatomic Location: L Clavicle        Pre-op Size: 2.2 cm x 2.0 cm    Post-op Diagnosis:  Squamous cell carcinoma      Post-op Size: 2.6 cm x 2.4 cm    Procedure:  Surgical excision of a squamous cell carcinoma with continuous microscopic control (Mohs micrographic technique).    Surgeon: Rejeana Brock, M.D.      Assistant Surgeon: none    Anesthesia:  Lidocaine 1% with epinephrine.     Indications:  Because of the pathology (Size > 2.0 cm) and location of the tumor, Mohs micrographic surgery is indicated to allow for complete removal of the skin tumor with maximal preservation of tissue and to minimize risk for recurrence.      Preparation:  The diagnosis, procedure, risks, benefits and alternative procedures, and consequence of refusal of treatment were discussed with the patient prior to the administration of any medication.  All of the patient???s questions were answered.  Proper informed consent for the surgery and photography were obtained.    Procedure: The procedure consisted of excision of layers of tissue containing tumor and systematic examination of frozen sections.  This enables [place] to trace out the tumor extensions, sacrificing the minimal amount of normal tissue and, at the same time, achieving optimal cure rate.  At each stage, the patient was taken to the surgical suite, placed in the supine position, and was prepped and draped in the usual manner.  The area to be excised was outlined and was anesthetized with local anesthesia.    Stage I: Curettage was performed to further delineate extent of the tumor margins and surgically debulk the tumor.  A layer of tissue encompassing the previously debulked area was surgically excised in a tangential plane with a No. 15 blade. Hemostasis was achieved with biterminal electrocoagulation and/or suture ligatures and a dry pressure dressing was used to cover the surgical defect. The patient was then escorted to the waiting room.    A reference map was drawn to indicate the orientation of the excised tissue.  The surgical specimen was cut into 1 sections, and the edges of each section were colored with separated dyes so that the precise orientation was achieved.  Each section was numbered and each number corresponded to a section on the map. The tissue sections were frozen, processed in accordance with the Mohs histological technique, and stained with hematoxylin and eosin.    The prepared microscopic sections were examined.  Areas of residual tumor were identified (see Mohs pathology card) and marked on the reference map to indicate where further surgery was necessary.  The patient was informed of the findings and prepared in the usual manner for the next stage of Mohs micrographic surgery.    Stage II:  A layer of tissue encompassing areas of residual tumor was surgically excised in a tangential plane with a No. 15 blade. Hemostasis was achieved with biterminal electrocoagulation and/or suture ligatures and a dry pressure dressing was used to cover the surgical defect.  The patient was then escorted to the waiting room.    A reference map was drawn to indicate the orientation of the excised tissue.  The surgical specimen was cut into 2 sections, and the edges of each section were colored with separated dyes so that the precise orientation was achieved.  Each section was numbered and each number corresponded to  a section on the map. The tissue sections were frozen, processed in accordance with the Mohs histological technique, and stained with hematoxylin and eosin.    The  prepared microscopic sections were examined.  Evaluation of the microscopic sections revealed tumor-free margins had been achieved.    Estimated Blood Loss: Minimal    Complications: None    Condition of Patient After Surgery: Satisfactory     Disposition of Surgical Defect:  It was determined that the wound site would be best managed by complex repair     Disposition of Patient:  The patient was discharged to home with written postoperative dressing instructions as well as doctor's [facility] On Call Service pager number.    Postoperative Medications: none.    1) Post-operative diagnosis: SCC  2) Mohs micrographic surgery defect, L Clavicle, measuring 2.6 cm x 2.4 cm; Maximum width of the defect perpendicular to the line of closure 2.4 cm.    3) History of SCC    Procedure:  Primary multilayered complex closure of Mohs surgery defect.    Surgeon: Rejeana Brock, M.D.      Assistant Surgeon: none    Indication: The patient underwent Mohs surgery earlier today and now presents for closure of the wound.  The nature of the procedure, risks, benefits, and alternative procedures,and consequence of refusal of treatment were discussed with the patient.  All of the patient???s questions were addressed.  The patient understands and has agreed to proceed with the procedure.  Informed consent for the surgery and photography was obtained.       Local Anesthesia: 1% lidocaine with epinephrine    Details of Procedure: The patient was prepped and draped in usual fashion.  Local anesthesia was infiltrated into the skin adjacent to the Mohs surgery defect.  After adequate anesthesia was given, vertical realignment of the wound edges was achieved and extensive undermining was done equal to or greater than the maximum width of the defect along at lease one entire edge of the defect.  The defect was converted into an ellipse and standing cones excised medial and lateral.  Complete hemostasis was achieved.  Wound edges were closed in a layered fashion using subcutaneous 4-0 vicryl interrupted sutures followed by superficial  5-0 fast absorbing gut running sutures.  The final length of the wound measured 4.2 cm.  The patient tolerated procedure well.  A pressure dressing was applied.      Complication(s): None    Estimated Blood Loss: Minimal Disposition of patient: Wound care was discussed and post-operative written instructions given to the patient.

## 2020-07-11 ENCOUNTER — Telehealth: Payer: BLUE CROSS/BLUE SHIELD

## 2020-07-11 NOTE — Telephone Encounter
Call Back Request      Reason for call back:  Per patient she has a few  questions for Dr. Gavin Potters, and would like to speak with her.     Gut not doing very well.   Hemmorhoids are bothering her, and would like to  have a topical prescribed if  possible.       Any Symptoms:  [x]  Yes  []  No      ? If yes, what symptoms are you experiencing:   Gut Discomfort, and  Hemmorhoids   o Duration of symptoms (how long):  Ongoing   o Have you taken medication for symptoms (OTC or Rx):   Metamucil   Patient or caller has been notified of the 24-48 hour turnaround time.

## 2020-08-07 ENCOUNTER — Ambulatory Visit: Payer: MEDICARE

## 2020-08-07 MED ORDER — HYDROCORTISONE ACETATE 25 MG RE SUPP
TOPICAL | 0 refills | 7.00000 days | Status: AC
Start: 2020-08-07 — End: ?

## 2020-08-07 NOTE — Telephone Encounter
Please call her and let her know I have ordered Anusol HC suppositories - these have hydrocortisone (much weaker than dexamethasone).  She should arrange for a follow up with me to discuss her GI issues.  Video is OK if she doesn't need an exam.

## 2020-08-08 NOTE — Telephone Encounter
Pt is extremely upset that it took 1 month to hear back and the MD is not calling her.   Relayed MDs message. She will try to Supps and call back if appt is needed

## 2020-10-20 MED ORDER — DEXLANSOPRAZOLE 60 MG PO CPDR
ORAL_CAPSULE | 5 refills | Status: AC
Start: 2020-10-20 — End: ?

## 2021-04-19 MED ORDER — DEXLANSOPRAZOLE 60 MG PO CPDR
ORAL_CAPSULE | 5 refills | Status: AC
Start: 2021-04-19 — End: ?

## 2021-05-08 ENCOUNTER — Ambulatory Visit: Payer: MEDICARE

## 2021-10-18 MED ORDER — DEXLANSOPRAZOLE 60 MG PO CPDR
ORAL_CAPSULE | 1 refills | Status: AC
Start: 2021-10-18 — End: ?

## 2022-06-01 MED ORDER — DEXLANSOPRAZOLE 60 MG PO CPDR
ORAL_CAPSULE | 1 refills | Status: AC
Start: 2022-06-01 — End: ?

## 2022-11-16 MED ORDER — DEXLANSOPRAZOLE 60 MG PO CPDR
60 mg | ORAL_CAPSULE | Freq: Every day | ORAL | 1 refills
Start: 2022-11-16 — End: ?

## 2022-11-19 ENCOUNTER — Telehealth: Payer: MEDICARE

## 2022-11-19 MED ORDER — DEXLANSOPRAZOLE 60 MG PO CPDR
60 mg | ORAL_CAPSULE | Freq: Every day | ORAL | 1 refills
Start: 2022-11-19 — End: ?

## 2022-11-19 NOTE — Telephone Encounter
Message to Practice/Provider      Message: pharmacy calling in wanting to place order for refill for DEXLANSOPRAZOLE 60 mg DR capsule, please send over to  [facility], [place]     RX request sent over 1/05    Return call is not being requested by the patient or caller.    Patient or caller has been notified of the turnaround time of 1-2 business day(s).

## 2022-11-20 MED ORDER — DEXLANSOPRAZOLE 60 MG PO CPDR
60 mg | ORAL_CAPSULE | Freq: Every day | ORAL | 3 refills | Status: AC
Start: 2022-11-20 — End: ?

## 2022-11-21 MED ORDER — DEXLANSOPRAZOLE 60 MG PO CPDR
60 mg | ORAL_CAPSULE | Freq: Every day | ORAL | 1 refills | Status: AC
Start: 2022-11-21 — End: ?
